# Patient Record
Sex: Female | Born: 1955 | Race: White | Marital: Married | Smoking: Former smoker
Health system: Northeastern US, Academic
[De-identification: ages and names within clinical notes are randomized; demographics above are authoritative.]

## PROBLEM LIST (undated history)

## (undated) HISTORY — PX: BREAST SURGERY: SHX581

## (undated) HISTORY — PX: OTHER SURGICAL HISTORY: SHX169

## (undated) HISTORY — PX: JOINT REPLACEMENT: SHX530

## (undated) HISTORY — PX: HYSTERECTOMY: SHX81

## (undated) HISTORY — PX: ROTATOR CUFF REPAIR: SHX139

---

## 2016-09-21 ENCOUNTER — Inpatient Hospital Stay: Admit: 2016-09-21 | Discharge: 2016-09-21 | Disposition: A | Discharge status: Home / Self Care | Payer: Self-pay

## 2017-09-23 ENCOUNTER — Inpatient Hospital Stay: Admit: 2017-09-23 | Discharge: 2017-09-23 | Disposition: A | Discharge status: Home / Self Care | Payer: Self-pay

## 2019-03-12 ENCOUNTER — Inpatient Hospital Stay: Admit: 2019-03-12 | Discharge: 2019-03-12 | Disposition: A | Discharge status: Home / Self Care | Payer: Self-pay

## 2019-06-16 ENCOUNTER — Telehealth: Payer: Self-pay

## 2019-06-16 NOTE — Telephone Encounter (Signed)
Writer called patient in regards to [place] with our office     Patient stated she wanted Dr.Berliant for her an her husband     Our faculty Provider's at [facility] are not accepting patient's at this time     Writer stated our Resident provider's are available    Patient stated its okay she will continue looking elsewhere with husband

## 2019-06-16 NOTE — Telephone Encounter (Signed)
Copied from [place] [id number]. Topic: Appointments - Schedule Appointment  >> Jun 16, 2019 10:46 AM Lynn Wagner wrote:  [name] is calling to schedule an appointment.    Appointment Type: NPV  Provider: Dr. Alonza Smoker - she used to be his patient many years ago, she will accept another doctor if he doesn't take her  What does patient need to be seen for?: NPV  Is there a referral on file?: no  Preferred location?: no  Type of insurance:MVP  Were the demographics and insurance verified?: yes  Was the patient connected to [facility] if applicable?: yes  Was the appropriate expectation set with the patient for a time frame to receive a return call from the office? yes    Patient can be reached at: [phone]

## 2019-06-30 ENCOUNTER — Other Ambulatory Visit
Admission: RE | Admit: 2019-06-30 | Discharge: 2019-06-30 | Disposition: A | Discharge status: Home / Self Care | Payer: No Typology Code available for payment source | Source: Ambulatory Visit | Attending: Internal Medicine | Admitting: Internal Medicine

## 2019-06-30 DIAGNOSIS — R5383 Other fatigue: Secondary | ICD-10-CM | POA: Insufficient documentation

## 2019-06-30 LAB — COMPREHENSIVE METABOLIC PANEL
ALT: 14 U/L (ref 0–35)
AST: 17 U/L (ref 0–35)
Albumin: 4.5 g/dL (ref 3.5–5.2)
Alk Phos: 89 U/L (ref 35–105)
Anion Gap: 10 (ref 7–16)
Bilirubin,Total: 0.5 mg/dL (ref 0.0–1.2)
CO2: 25 mmol/L (ref 20–28)
Calcium: 9.3 mg/dL (ref 8.6–10.2)
Chloride: 105 mmol/L (ref 96–108)
Creatinine: 0.86 mg/dL (ref 0.51–0.95)
GFR,Black: 83 *
GFR,Caucasian: 72 *
Glucose: 96 mg/dL (ref 60–99)
Lab: 17 mg/dL (ref 6–20)
Potassium: 4.7 mmol/L (ref 3.3–5.1)
Sodium: 140 mmol/L (ref 133–145)
Total Protein: 6.2 g/dL — ABNORMAL LOW (ref 6.3–7.7)

## 2019-06-30 LAB — CBC AND DIFFERENTIAL
Baso # K/uL: 0 10*3/uL (ref 0.0–0.1)
Basophil %: 0.4 %
Eos # K/uL: 0.1 10*3/uL (ref 0.0–0.4)
Eosinophil %: 2.6 %
Hematocrit: 42 % (ref 34–45)
Hemoglobin: 13.3 g/dL (ref 11.2–15.7)
IMM Granulocytes #: 0 10*3/uL (ref 0.0–0.0)
IMM Granulocytes: 0.4 %
Lymph # K/uL: 1.7 10*3/uL (ref 1.2–3.7)
Lymphocyte %: 36.9 %
MCH: 30 pg/cell (ref 26–32)
MCHC: 32 g/dL (ref 32–36)
MCV: 95 fL (ref 79–95)
Mono # K/uL: 0.4 10*3/uL (ref 0.2–0.9)
Monocyte %: 9.3 %
Neut # K/uL: 2.3 10*3/uL (ref 1.6–6.1)
Nucl RBC # K/uL: 0 10*3/uL (ref 0.0–0.0)
Nucl RBC %: 0 /100 WBC (ref 0.0–0.2)
Platelets: 236 10*3/uL (ref 160–370)
RBC: 4.4 MIL/uL (ref 3.9–5.2)
RDW: 13.6 % (ref 11.7–14.4)
Seg Neut %: 50.4 %
WBC: 4.5 10*3/uL (ref 4.0–10.0)

## 2019-06-30 LAB — TSH: TSH: 1.87 u[IU]/mL (ref 0.27–4.20)

## 2019-06-30 LAB — LIPID PANEL
Chol/HDL Ratio: 2.9
Cholesterol: 218 mg/dL — AB
HDL: 76 mg/dL — ABNORMAL HIGH (ref 40–60)
LDL Calculated: 122 mg/dL
Non HDL Cholesterol: 142 mg/dL
Triglycerides: 99 mg/dL

## 2019-06-30 LAB — MICROALBUMIN, URINE, RANDOM
Creatinine,UR: 29 mg/dL (ref 20–300)
Microalbumin,UR: <1.2 mg/dL

## 2019-06-30 LAB — HEMOGLOBIN A1C: Hemoglobin A1C: 5.3 %

## 2019-06-30 LAB — VITAMIN D: 25-OH Vit Total: 31 ng/mL (ref 30–60)

## 2019-06-30 LAB — GGT: GGT: 14 U/L (ref 5–36)

## 2019-07-16 ENCOUNTER — Other Ambulatory Visit: Payer: Self-pay | Admitting: Internal Medicine

## 2019-07-16 ENCOUNTER — Ambulatory Visit
Admission: RE | Admit: 2019-07-16 | Discharge: 2019-07-16 | Disposition: A | Discharge status: Home / Self Care | Payer: No Typology Code available for payment source | Source: Ambulatory Visit | Attending: Internal Medicine | Admitting: Internal Medicine

## 2019-07-16 DIAGNOSIS — M542 Cervicalgia: Secondary | ICD-10-CM | POA: Insufficient documentation

## 2019-07-16 DIAGNOSIS — M50321 Other cervical disc degeneration at C4-C5 level: Secondary | ICD-10-CM | POA: Insufficient documentation

## 2019-07-16 DIAGNOSIS — M4802 Spinal stenosis, cervical region: Secondary | ICD-10-CM

## 2019-08-14 ENCOUNTER — Ambulatory Visit: Payer: No Typology Code available for payment source | Admitting: Physical Medicine and Rehabilitation

## 2019-08-20 ENCOUNTER — Ambulatory Visit: Payer: No Typology Code available for payment source | Admitting: Physical Medicine and Rehabilitation

## 2019-08-28 ENCOUNTER — Encounter: Payer: Self-pay | Admitting: Gastroenterology

## 2019-08-28 ENCOUNTER — Ambulatory Visit: Payer: No Typology Code available for payment source | Admitting: Physical Medicine and Rehabilitation

## 2019-08-28 ENCOUNTER — Ambulatory Visit
Payer: No Typology Code available for payment source | Attending: Physical Medicine and Rehabilitation | Admitting: Rehabilitative and Restorative Service Providers"

## 2019-08-28 ENCOUNTER — Encounter: Payer: Self-pay | Admitting: Physical Medicine and Rehabilitation

## 2019-08-28 VITALS — BP 111/74 | HR 73 | Ht 64.17 in | Wt 170.4 lb

## 2019-08-28 DIAGNOSIS — S161XXD Strain of muscle, fascia and tendon at neck level, subsequent encounter: Secondary | ICD-10-CM | POA: Insufficient documentation

## 2019-08-28 DIAGNOSIS — M542 Cervicalgia: Secondary | ICD-10-CM

## 2019-08-28 NOTE — Progress Notes (Signed)
Physical Therapy Daily Flowsheet:  *Please see Physical Therapy Exercise Flowsheet for details regarding exercises completed this session.*     08/28/19 1100   Overview   Diagnosis neck pain   Insurance Medicare   Script Date 08/28/19   Visit # 1   Additional Comments Next appointment made prior to PT session completed   Pain Assessment    0-10 Scale 3;8   Pain Location Neck   Pain Orientation Lower   Modalities   Time MH CS   Patient Education   Educated in disease process Yes   Educated in home exercise program Yes   Time Calculation   PT Timed Codes 8   PT Untimed Codes 15   PT Total Treatment 23       Lorenza Chick PT, DPT
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
## 2019-08-28 NOTE — Progress Notes (Signed)
Physical Therapy Exercise Flowsheet:  *Please refer to Physical Therapy Daily Flowsheet for further details of this session.*     08/28/19 1100   Cervical Exercises   Levator Scapular Stretch Comment 1 set, 10x, tol well   Turtle Exercise Comment 1 set, 10x, tol well   Upper Trapezius Stretch, Sitting Comment 1 set, 10x, tol well   Addtional Exercises Carpal tunnel stretch 1 set, 10x, tol well   Shoulder Exercises   Scapular Retraction, Standing Comment 1 set, 10x, tol well   Shoulder Circles Comment 1 set, 10x, tol well   Total time 8 minutes       Lorenza Chick PT, DPT
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
## 2019-09-04 ENCOUNTER — Ambulatory Visit: Payer: No Typology Code available for payment source

## 2019-09-07 ENCOUNTER — Ambulatory Visit: Payer: No Typology Code available for payment source

## 2019-09-07 DIAGNOSIS — S161XXD Strain of muscle, fascia and tendon at neck level, subsequent encounter: Secondary | ICD-10-CM

## 2019-09-07 NOTE — Progress Notes (Signed)
Physical Therapy Exercise Flowsheet:  *Please refer to Physical Therapy Daily Flowsheet for further details of this session.*     09/07/19 0900   Cervical Exercises   Cervical Exercises Yes   Levator Scapular Stretch Comment 10 sec, 5 reps   Turtle Exercise Comment 10 rep, 5 sec   Upper Trapezius Stretch, Sitting Comment 10 sec, 5 reps   Addtional Exercises Carpal tunnel stretch 1 set, 10x, tol well   Total time 15 min   Shoulder Exercises   Shoulder Exercises Yes   Scapular Retraction, Standing Comment 15 reps, 5 sec   Shoulder Circles Comment 15 reps, 5 sec   Total time 10 min   Manual Therapy   Manual Therapy Yes   Myofacial Release Comments uppertraps, scalene, levator, rhomboid   Total time 10 min   Filipe Greathouse Hennie Duos, PTA

## 2019-09-07 NOTE — Progress Notes (Signed)
Physical Therapy Daily Flowsheet:  *Please see Physical Therapy Exercise Flowsheet for details regarding exercises completed this session.*     09/07/19 0900   Overview   Diagnosis neck pain   Insurance Medicare   Script Date 08/28/19   Visit # 2   Pain Assessment    0-10 Scale 2;3   Pain Location Neck   Pain Orientation Lower   Modalities   Modalities Yes   Moist Heat Yes   Time 10 min   Patient Education   Patient Education Yes   Educated in disease process Yes   Educated in home exercise program Yes   Time Calculation   PT Timed Codes 35 min   PT Untimed Codes 10 min   PT Unbilled Time 0   PT Total Treatment 45   Lynn Wagner [place], PTA

## 2019-09-11 ENCOUNTER — Ambulatory Visit: Payer: No Typology Code available for payment source

## 2019-09-11 DIAGNOSIS — S161XXD Strain of muscle, fascia and tendon at neck level, subsequent encounter: Secondary | ICD-10-CM

## 2019-09-11 NOTE — Progress Notes (Signed)
Physical Therapy Daily Flowsheet:  *Please see Physical Therapy Exercise Flowsheet for details regarding exercises completed this session.*     09/11/19 1350   Overview   Diagnosis neck pain   Insurance Medicare   Script Date 08/28/19   Visit # 3   Pain Assessment    0-10 Scale 3   Pain Location Neck   Pain Orientation Lower   Modalities   Modalities Yes   Cold Pack Yes   Time 10 min   Moist Heat Yes   Time 10 min   Patient Education   Patient Education Yes   Educated in disease process Yes   Educated in home exercise program Yes   Time Calculation   PT Timed Codes 50 min   PT Untimed Codes 20 min   PT Unbilled Time 0   PT Total Treatment 70   Shanya Ferriss [place], PTA

## 2019-09-11 NOTE — Progress Notes (Signed)
Physical Therapy Exercise Flowsheet:  *Please refer to Physical Therapy Daily Flowsheet for further details of this session.*     09/11/19 1351   Cervical Exercises   Cervical Exercises Yes   Active Cervical Sideflexion Comment 10 sec, 5 reps   Levator Scapular Stretch Comment hold   Turtle Exercise Comment 10 reps, 5 sec   Upper Trapezius Stretch, Sitting Comment 10 sec, 5 reps   Addtional Exercises Carpal tunnel stretch 1 set, 10x, tol well   Total time 15 min   Shoulder Exercises   Shoulder Exercises Yes   Shoulder External Rotation, Theraband Comment level 2, 10 reps, 5 sec   Shoulder Flexion, Theraband Comment level 2, 10 reps, 5 sec   Shoulder Extension, Theraband Comment level 2, 10 reps, 5 sec   Scapular Retraction, Standing Comment 20 reps, 5 sec   Shoulder Circles Comment 20 reps, 5 sec   Total time 20 min   Manual Therapy   Manual Therapy Yes   Myofacial Release Comments uppertraps, scalene, levator, rhomboid   Total time 15 min   Windie Marasco Hennie Duos, PTA

## 2019-09-15 ENCOUNTER — Ambulatory Visit: Payer: No Typology Code available for payment source

## 2019-09-15 DIAGNOSIS — S161XXD Strain of muscle, fascia and tendon at neck level, subsequent encounter: Secondary | ICD-10-CM

## 2019-09-15 NOTE — Progress Notes (Signed)
Physical Therapy Exercise Flowsheet:  *Please refer to Physical Therapy Daily Flowsheet for further details of this session.*     09/15/19 1257   Cervical Exercises   Cervical Exercises Yes   Active Cervical Sideflexion Comment 10 sec, 5 reps   Levator Scapular Stretch Comment hold   Turtle Exercise Comment 10 reps, 5 sec   Upper Trapezius Stretch, Sitting Comment 10 sec, 5 reps   Addtional Exercises Carpal tunnel stretch 1 set, 10x, tol well   Total time 10 min   Shoulder Exercises   Shoulder Exercises Yes   Shoulder External Rotation, Theraband Comment level 2, 10 reps, 5 sec   Shoulder Flexion, Theraband Comment level 2, 10 reps, 5 sec   Shoulder Extension, Theraband Comment level 2, 10 reps, 5 sec   Total time 15 min   Manual Therapy   Manual Therapy Yes   Suboccipital Release Comments yes   Myofacial Release Comments uppertraps, scalene, levator, rhomboid   Total time 20 min   Shanquita Ronning Hennie Duos, PTA

## 2019-09-15 NOTE — Progress Notes (Signed)
Physical Therapy Daily Flowsheet:  *Please see Physical Therapy Exercise Flowsheet for details regarding exercises completed this session.*     09/15/19 1254   Overview   Diagnosis neck pain   Insurance Medicare   Script Date 08/28/19   Visit # 4   Pain Assessment   Pain X    0-10 Scale 4;5   Pain Location Neck   Pain Orientation Lower   Modalities   Modalities Yes   Cold Pack Yes   Time 10 min   Moist Heat Yes   Time 10 min   Patient Education   Patient Education Yes   Educated in disease process Yes   Educated in home exercise program Yes   Time Calculation   PT Timed Codes 45 min   PT Untimed Codes 20 min   PT Unbilled Time 0   PT Total Treatment 65   Taziyah Iannuzzi [place], PTA

## 2019-09-21 ENCOUNTER — Ambulatory Visit: Payer: No Typology Code available for payment source

## 2019-09-21 DIAGNOSIS — S161XXD Strain of muscle, fascia and tendon at neck level, subsequent encounter: Secondary | ICD-10-CM

## 2019-09-21 NOTE — Progress Notes (Signed)
Physical Therapy Exercise Flowsheet:  *Please refer to Physical Therapy Daily Flowsheet for further details of this session.*     09/21/19 0900   Cervical Exercises   Cervical Exercises Yes   Active Cervical Sideflexion Comment 10 sec, 5 reps   Turtle Exercise Comment 10 reps, 5 sec   Upper Trapezius Stretch, Sitting Comment 10 sec, 5 reps   Addtional Exercises Carpal tunnel stretch 1 set, 10x, tol well   Total time 10 min   Shoulder Exercises   Shoulder Exercises Yes   Shoulder External Rotation, Theraband Comment level 2,10 reps, 5 sec, B   Shoulder Flexion, Theraband Comment level 2, 10 reps, 5 sec, B   Shoulder Extension, Theraband Comment level 2, 10 reps, 5 sec, B   Total time 15 min   Manual Therapy   Manual Therapy Yes   Suboccipital Release Comments yes   Myofacial Release Comments uppertraps, scalene, levator, rhomboid   Total time 20 min   Harl Wiechmann Hennie Duos, PTA

## 2019-09-21 NOTE — Progress Notes (Signed)
Physical Therapy Daily Flowsheet:  *Please see Physical Therapy Exercise Flowsheet for details regarding exercises completed this session.*     09/21/19 0925   Overview   Diagnosis neck pain   Insurance Medicare   Script Date 08/28/19   Visit # 5   Pain Assessment   Pain X    0-10 Scale 2   Pain Location Neck   Pain Orientation Lower   ROM   Flexion   (38 degrees)   Extension   (40 degrees)   Right Lateral Flexion   (35 degrees)   Left Lateral Flexion   (27 degrees)   Right Rotation   (75 degrees)   Left Rotation   (55 degrees)   STRENGTH   R Shoulder Flexion  4+/5   R Shoulder Internal Rotation  4+/5   R Shoulder External Rotation (STR) 4/5;4+/5   L Shoulder Flexion  4/5   L Shoulder Internal Rotation  4/5;4+/5   L Shoulder External Rotation 4-/5   Modalities   Modalities Yes   Cold Pack Yes   Time 10 min   Moist Heat Yes   Time 10 min   Functional Outcome Measures   Self Reported Functional Measures Yes   Neck Disability Index Yes   Pain Intensity 2   Personal Care 0   Lifting 1   Reading 0   Headache 0   Concentration 1   Work 0   Driving 0   Sleeping 2   Recreation 2   Neck Disability Index Score 16   Patient Education   Patient Education Yes   Educated in disease process Yes   Educated in home exercise program Yes   Time Calculation   PT Timed Codes 45 min   PT Untimed Codes 20 min   PT Unbilled Time 0   PT Total Treatment 65   Grayce Budden [place], PTA

## 2019-09-23 NOTE — Progress Notes (Addendum)
Department of Physical Medicine & Rehabilitation  Physical Therapy Progress Note    HISTORY:  Diagnosis:    Diagnosis: neck pain  Date of Surgery/Onset Date of Symptoms:        11/29/2018  Referring Provider:        Julieta Bellini, MD  Frequency:    Twice a week  Attendance:    Consistent  Patient's compliance with therapy and home exercise program:      good   Treatment:    AROM/PROM/Therapeutic exercise, Cold pack, Flexibility, Heat, Home exercise program instructions/Patient education, Myofascial release, Patient/Family Education, Postural training/body Curator education, Strengthening    SUBJECTIVE:  Pain:    Improved     0-10 Scale: 2   Pain Location: Neck   Pain Orientation: Lower     OBJECTIVE:  ROM:    ROM   Flexion: (38 degrees)   Extension: (40 degrees)   Right Lateral Flexion: (35 degrees)   Left Lateral Flexion: (27 degrees)   Right Rotation: (75 degrees)   Left Rotation: (55 degrees)  Strength:    STRENGTH   R Shoulder Flexion : 4+/5   R Shoulder Internal Rotation : 4+/5   R Shoulder External Rotation (STR): 4/5, 4+/5   L Shoulder Flexion : 4/5   L Shoulder Internal Rotation : 4/5, 4+/5   L Shoulder External Rotation: 4-/5  Function:    Improved   Functional Tasks:     Improving with: turning head side to side, reaching up into cupboards,               Table tops activities      Still difficulty with the following: pain with prolonged sitting, prolonged looking               Up and looking down    Functional Outcome Measures:    Self-Reported:     Neck Disability Index (NDI): Neck Disability Index Score: 16   Patient Education   Patient Education: Yes   Educated in disease process: Yes   Educated in home exercise program: Yes       Rehab potential/prognosis: good     Patient's understanding: good     Assessment:   Patient is a 63 y.o. female presents to physical therapy with pain secondary to signs and symptoms consistent with Diagnosis: Cervical/trap strain. Patient is slowly  making progress with ROM and strength but continues to have pain and difficulty with looking up activities, lifting things and prolonged sitting. Patient would benefit from continued skilled PT to improve strength, ROM and address goals below.    Short-term goals  ( two weeks)    1. Demonstrate an exercise program - Achieved  2. Patient education about postural mechanics - Achieved  3. Patient to sit erect 1 min. - Achieved    Long-term goals (four weeks)    1. Independent with a home exercise program - on going  2. Independent patient education about postural mechanics - Achieved  3. Patient to turn head without difficulty - partially achieved/progressing  4. Resume reaching and overhead activities without difficulty - partially achieved/progressing      Updated goals    Short-term goals  ( two weeks)    1. Independent with a home exercise program     Long-term goals (four weeks)    1. Patient to turn head without difficulty   2. Resume reaching and overhead activities without difficulty   3. Decrease NDI to <= 10                                                                           (  New goal )    Patient Goals for Therapy: Home exercise program     Thank you for the referral. If you have any questions and/or concerns, please feel free to contact me at [phone].     Plan: Continue PT 1 to 2 x per week for 1 month    Sherrielyn Hennie Duos, [place]    Lorenza Chick PT, DPT    Department of Physical [facility]    Physician:  Lenard Simmer, MD    I have reviewed your progress note and agree with the documented goals and Plan of Care      09/23/2019

## 2019-09-24 ENCOUNTER — Ambulatory Visit: Payer: No Typology Code available for payment source | Attending: Physical Medicine and Rehabilitation

## 2019-09-24 DIAGNOSIS — S161XXD Strain of muscle, fascia and tendon at neck level, subsequent encounter: Secondary | ICD-10-CM | POA: Insufficient documentation

## 2019-09-24 NOTE — Progress Notes (Signed)
Physical Therapy Exercise Flowsheet:  *Please refer to Physical Therapy Daily Flowsheet for further details of this session.*     09/24/19 0900   Cervical Exercises   Cervical Exercises Yes   Active Cervical Sideflexion Comment 10 sec, 5 reps   Turtle Exercise Comment 10 reps, 5 sec   Upper Trapezius Stretch, Sitting Comment 10 sec, 5 reps   Addtional Exercises Carpal tunnel stretch 1 set, 10x, tol well   Total time 10 min   Shoulder Exercises   Shoulder Exercises Yes   Shoulder External Rotation, Theraband Comment level 3, 10 reps, 5 sec   Shoulder Flexion, Theraband Comment level 3, 10 reps, 5 sec   Shoulder Extension, Theraband Comment level 3, 10 reps, 5 sec   Scapular Retraction, Standing Comment level 3, 10 reps, 5 sec   Total time 15 min   Manual Therapy   Manual Therapy Yes   Suboccipital Release Comments yes   Myofacial Release Comments uppertraps, scalene, levator, rhomboid   Total time 21 min   Ailsa Mireles Hennie Duos, PTA

## 2019-09-24 NOTE — Progress Notes (Signed)
Physical Therapy Daily Flowsheet:  *Please see Physical Therapy Exercise Flowsheet for details regarding exercises completed this session.*     09/24/19 0900   Overview   Diagnosis neck pain   Insurance Medicare   Script Date 08/28/19   Visit # 6   Pain Assessment   Pain X    0-10 Scale 2;3   Pain Location Neck   Pain Orientation Lower   Modalities   Modalities Yes   Cold Pack Yes   Time 10 min   Moist Heat Yes   Time 10 min   Patient Education   Patient Education Yes   Educated in disease process Yes   Educated in home exercise program Yes   Time Calculation   PT Timed Codes 46 min   PT Untimed Codes 20 min   PT Unbilled Time 0   PT Total Treatment [address]   Deagan Sevin [address], PTA

## 2019-09-29 ENCOUNTER — Ambulatory Visit: Payer: No Typology Code available for payment source

## 2019-10-02 ENCOUNTER — Ambulatory Visit: Payer: No Typology Code available for payment source

## 2019-10-02 DIAGNOSIS — S161XXD Strain of muscle, fascia and tendon at neck level, subsequent encounter: Secondary | ICD-10-CM

## 2019-10-02 NOTE — Progress Notes (Signed)
Physical Therapy Daily Flowsheet:  *Please see Physical Therapy Exercise Flowsheet for details regarding exercises completed this session.*     10/02/19 1353   Overview   Diagnosis neck pain   Insurance Medicare   Script Date 08/28/19   Visit # 7   Pain Assessment   Pain X    0-10 Scale 2;3   Pain Location Neck   Pain Orientation Lower   Modalities   Modalities Yes   Cold Pack Yes   Time 10 min   Moist Heat Yes   Time 10 min   Patient Education   Patient Education Yes   Educated in disease process Yes   Educated in home exercise program Yes   Time Calculation   PT Timed Codes 45 min   PT Untimed Codes 20 min   PT Unbilled Time 0   PT Total Treatment 65   Kasen Sako [place], PTA

## 2019-10-02 NOTE — Progress Notes (Signed)
Physical Therapy Exercise Flowsheet:  *Please refer to Physical Therapy Daily Flowsheet for further details of this session.*     10/02/19 1353   Cervical Exercises   Cervical Exercises Yes   Active Cervical Sideflexion Comment 10 sec, 5 reps   Turtle Exercise Comment 10 reps, 5 sec   Upper Trapezius Stretch, Sitting Comment 10 sec, 5 reps   Addtional Exercises Carpal tunnel stretch 1 set, 10x, tol well   Total time 10 min   Shoulder Exercises   Shoulder Exercises Yes   Shoulder External Rotation, Theraband Comment level 3,10 reps, 5 sec   Shoulder Flexion, Theraband Comment level 3, 10 reps, 5 sec   Shoulder Extension, Theraband Comment level 3, 10 reps, 5 sec   Scapular Retraction, Standing Comment level 3, 10 reps, 5 sec   Total time 15 min   Manual Therapy   Manual Therapy Yes   Suboccipital Release Comments yes   Myofacial Release Comments uppertraps, scalene, levator   Total time 20 min   Gus Littler Hennie Duos, PTA

## 2019-10-05 ENCOUNTER — Ambulatory Visit: Payer: No Typology Code available for payment source

## 2019-10-05 DIAGNOSIS — S161XXD Strain of muscle, fascia and tendon at neck level, subsequent encounter: Secondary | ICD-10-CM

## 2019-10-05 NOTE — Progress Notes (Signed)
Physical Therapy Exercise Flowsheet:  *Please refer to Physical Therapy Daily Flowsheet for further details of this session.*     10/05/19 0951   Cervical Exercises   Cervical Exercises Yes   Active Cervical Sideflexion Comment 10 sec, 5 reps   Turtle Exercise Comment 10 reps, 5 sec   Upper Trapezius Stretch, Sitting Comment 10 sec, 5 reps   Addtional Exercises Carpal tunnel stretch 1 set, 10x, tol well   Total time 10 min   Shoulder Exercises   Shoulder Exercises Yes   Shoulder External Rotation, Theraband Comment level 3, 10 reps, 5 sec   Shoulder Flexion, Theraband Comment level 3, 10 reps, 5 sec   Shoulder Extension, Theraband Comment level 3, 10 reps, 5 sec   Scapular Retraction, Standing Comment level 3, 10 reps, 5 sec   Total time 14 min   Manual Therapy   Manual Therapy Yes   Suboccipital Release Comments yes   Myofacial Release Comments uppertraps, scalene, levator   Total time 20 min   Karson Chicas Hennie Duos, PTA

## 2019-10-05 NOTE — Progress Notes (Signed)
Physical Therapy Daily Flowsheet:  *Please see Physical Therapy Exercise Flowsheet for details regarding exercises completed this session.*     10/05/19 0951   Overview   Diagnosis neck pain   Insurance Medicare   Script Date 08/28/19   Visit # 8   Pain Assessment   Pain X    0-10 Scale 2   Pain Location Neck   Pain Orientation Lower   Modalities   Modalities Yes   Cold Pack Yes   Time 10 min   Moist Heat Yes   Time 10 min   Patient Education   Patient Education Yes   Educated in disease process Yes   Educated in home exercise program Yes   Time Calculation   PT Timed Codes 44 min   PT Untimed Codes 20 min   PT Unbilled Time 0   PT Total Treatment 31   Taleisha Kaczynski [place], PTA

## 2019-10-09 ENCOUNTER — Ambulatory Visit: Payer: No Typology Code available for payment source

## 2019-10-09 ENCOUNTER — Ambulatory Visit: Payer: No Typology Code available for payment source | Admitting: Physical Medicine and Rehabilitation

## 2019-10-09 ENCOUNTER — Encounter: Payer: Self-pay | Admitting: Physical Medicine and Rehabilitation

## 2019-10-09 VITALS — BP 150/68 | HR 56 | Ht 64.0 in | Wt 170.0 lb

## 2019-10-09 DIAGNOSIS — M542 Cervicalgia: Secondary | ICD-10-CM

## 2019-10-09 DIAGNOSIS — S161XXD Strain of muscle, fascia and tendon at neck level, subsequent encounter: Secondary | ICD-10-CM

## 2019-10-09 MED ORDER — BUPIVACAINE HCL 0.25 % IJ SOLUTION *WRAPPED*
1.0000 mL | Freq: Once | Status: AC | PRN
Start: 2019-10-09 — End: 2019-10-09
  Administered 2019-10-09: 1 mL via SUBCUTANEOUS

## 2019-10-09 NOTE — Progress Notes (Signed)
Physical Therapy Daily Flowsheet:  *Please see Physical Therapy Exercise Flowsheet for details regarding exercises completed this session.*     10/09/19 1300   Overview   Diagnosis neck pain   Insurance Medicare   Script Date 08/28/19   Visit # 9   Additional Comments Patient just recieved trigger point injection.   Pain Assessment   Pain X    0-10 Scale 2   Pain Location Neck   Modalities   Modalities Yes   Cold Pack Yes   Time 10 min   Moist Heat Yes   Time 10 min   Patient Education   Patient Education Yes   Educated in disease process Yes   Educated in home exercise program Yes   Time Calculation   PT Timed Codes 30 min   PT Untimed Codes 20 min   PT Unbilled Time 0   PT Total Treatment 50   Linnaea Ahn [place], PTA

## 2019-10-09 NOTE — Progress Notes (Addendum)
Ms. Lynn Wagner is here today for a follow-up visit for her neck pain.  Overall she is reporting improvement in her pain and range of motion.  She has been attending physical therapy on a regular basis.  She still has some difficulty turning her neck to the right.    On exam patient has tenderness to palpation in bilateral upper traps.    Impression/plan: Cervical myofascial pain in the setting of cervical spondylosis.  Patient to continue physical therapy at this time.  I will perform a trigger point injection into bilateral cervical muscles.    Please refer to procedure note.    Patient is going to [place] for 9 weeks.  She would like to continue therapy there.  I have given her a prescription to have therapy out of state.

## 2019-10-09 NOTE — Progress Notes (Signed)
Physical Therapy Exercise Flowsheet:  *Please refer to Physical Therapy Daily Flowsheet for further details of this session.*     10/09/19 1300   Cervical Exercises   Cervical Exercises Yes   Active Cervical Sideflexion Comment 10 sec, 5 reps   Turtle Exercise Comment 10 reps, 5 sec   Upper Trapezius Stretch, Sitting Comment 10 sec, 5 reps   Addtional Exercises Carpal tunnel stretch 1 set, 10x, tol well   Total time 10 min   Manual Therapy   Manual Therapy Yes   Suboccipital Release Comments yes   Myofacial Release Comments uppertraps, scalene, levator   Total time 20 min   Dontre Laduca Hennie Duos, PTA

## 2019-10-09 NOTE — Procedures (Signed)
Injection - Tendon/Other    Date/Time: 10/09/2019 10:45 AM EST  Consent given by: patient  Site marked: site marked  Timeout: Immediately prior to procedure a time out was called to verify the correct patient, procedure, equipment, support staff and site/side marked as required     Procedure Details  Trigger Points: 1-2 muscles  Preparation: The site was prepped using the usual aseptic technique.  Anesthetics administered: 1 mL bupivacaine 0.25%; 1 mL bupivacaine 0.25%  Dressing:  A dry, sterile dressing was applied.  Patient tolerance: patient tolerated the procedure well with no immediate complications

## 2019-10-19 ENCOUNTER — Ambulatory Visit: Payer: No Typology Code available for payment source

## 2019-10-19 DIAGNOSIS — S161XXD Strain of muscle, fascia and tendon at neck level, subsequent encounter: Secondary | ICD-10-CM

## 2019-10-19 NOTE — Progress Notes (Signed)
Physical Therapy Daily Flowsheet:  *Please see Physical Therapy Exercise Flowsheet for details regarding exercises completed this session.*     10/19/19 1319   Overview   Diagnosis neck pain   Insurance Medicare   Script Date 08/28/19   Visit # 10   Pain Assessment   Pain Denies   Modalities   Modalities Yes   Cold Pack Yes   Time 10 min   Moist Heat Yes   Time 10 min   Patient Education   Patient Education Yes   Educated in disease process Yes   Educated in home exercise program Yes   Time Calculation   PT Timed Codes 43 min   PT Untimed Codes 20 min   PT Unbilled Time 0   PT Total Treatment 80   Juquan Reznick [place], PTA

## 2019-10-19 NOTE — Progress Notes (Signed)
Physical Therapy Exercise Flowsheet:  *Please refer to Physical Therapy Daily Flowsheet for further details of this session.*     10/19/19 1324   Cervical Exercises   Cervical Exercises Yes   Active Cervical Sideflexion Comment 10 sec, 5 reps   Turtle Exercise Comment 10 reps, 5 sec   Upper Trapezius Stretch, Sitting Comment 10 sec, 5 reps   Addtional Exercises Carpal tunnel stretch, 10 reps, 5 sec   Total time 10 min   Shoulder Exercises   Shoulder Exercises Yes   Shoulder External Rotation, Theraband Comment level 3, 10 reps, 5 sec   Shoulder Flexion, Theraband Comment level 3, 10 reps, 5 sec   Shoulder Extension, Theraband Comment level 3, 10 reps, 5 sec   Scapular Retraction, Standing Comment level 3, 10 reps, 5 sec   Total time 13 min   Manual Therapy   Manual Therapy Yes   Suboccipital Release Comments yes   Myofacial Release Comments uppertraps, scalene, levator   Total time 20 min   Zenovia Justman Hennie Duos, PTA

## 2019-10-26 ENCOUNTER — Ambulatory Visit: Payer: No Typology Code available for payment source | Attending: Physical Medicine and Rehabilitation

## 2019-10-26 DIAGNOSIS — S161XXD Strain of muscle, fascia and tendon at neck level, subsequent encounter: Secondary | ICD-10-CM | POA: Insufficient documentation

## 2019-10-26 NOTE — Progress Notes (Signed)
Physical Therapy Exercise Flowsheet:  *Please refer to Physical Therapy Daily Flowsheet for further details of this session.*     10/26/19 1330   Cervical Exercises   Cervical Exercises Yes   Active Cervical Sideflexion Comment 10 sec, 5 reps   Turtle Exercise Comment 10 reps, 5 sec   Upper Trapezius Stretch, Sitting Comment 10 sec, 5 reps   Addtional Exercises Carpal tunnel stretch, 10 reps, 5 sec   Total time 10 min   Shoulder Exercises   Shoulder Exercises Yes   Shoulder External Rotation, Theraband Comment level 4, 10 reps, 5 sec   Shoulder Flexion, Theraband Comment level 4, 10 reps, 5 sec   Shoulder Extension, Theraband Comment level 4, 10 reps, 5 sec   Scapular Retraction, Standing Comment level 4, 10 reps, 5 sec   Total time 12 min   Manual Therapy   Manual Therapy Yes   Suboccipital Release Comments yes   Myofacial Release Comments uppertraps, scalene, levator, rhomboid   Total time 20 min   Makenleigh Crownover Eulis Foster, PTA

## 2019-10-26 NOTE — Progress Notes (Signed)
Physical Therapy Daily Flowsheet:  *Please see Physical Therapy Exercise Flowsheet for details regarding exercises completed this session.*     10/26/19 1330   Overview   Diagnosis neck pain   Insurance Medicare   Script Date 08/28/19   Visit # 11   Pain Assessment   Pain Denies   Modalities   Modalities Yes   Cold Pack Yes   Time 10 min   Moist Heat Yes   Time 10 min   Functional Outcome Measures   Self Reported Functional Measures Yes   Neck Disability Index Yes   Pain Intensity 1   Personal Care 0   Lifting 0   Reading 1   Headache 1   Concentration 0   Work 2   Driving 0   Sleeping 2   Recreation 2   Neck Disability Index Score 18   Patient Education   Patient Education Yes   Educated in disease process Yes   Educated in home exercise program Yes   Time Calculation   PT Timed Codes 42 min   PT Untimed Codes 20 mn   PT Unbilled Time 0   PT Total Treatment 62   Jayro Mcmath [place], PTA

## 2019-10-27 NOTE — Progress Notes (Addendum)
Department of Physical Medicine & Rehabilitation  Physical Therapy Progress Note    HISTORY:  Diagnosis:    Diagnosis: neck pain  Date of Surgery/Onset Date of Symptoms:        11/29/2018  Referring Provider:        Julieta Bellini, MD  Frequency:    Once a week  Attendance:    Consistent  Patient's compliance with therapy and home exercise program:      good   Treatment:    AROM/PROM/Therapeutic exercise, Cold pack, Flexibility, Heat, Home exercise program instructions/Patient education, Myofascial release, Patient/Family Education, Postural training/body Curator education, Strengthening    SUBJECTIVE:  Pain:    Improved      OBJECTIVE:  ROM:   ROM  ROM: Yes  Flexion: (37 degrees)  Extension: (26 degrees)  Right Lateral Flexion: (38 degrees)  Left Lateral Flexion: (28 degrees)  Right Rotation: (75 degrees)  Left Rotation: (65 degrees)  Strength:   STRENGTH  Strength: Yes  R Shoulder Flexion : 5/5  R Shoulder Internal Rotation : 5/5  R Shoulder External Rotation (STR): 4+/5  L Shoulder Flexion : 4/5, 4+/5  L Shoulder Internal Rotation : 4+/5  L Shoulder External Rotation: 4/5, 4+/5   Level 4 Theraband Exercises  Function:    Improved   Functional Tasks:     Improving with: turning head side to side with driving,  overhead reaching, table tops activities,              Decreased pain with prolonged sitting                      Still difficulty with the following: turning head all the way with driving    Functional Outcome Measures:    Self-Reported:     Neck Disability Index (NDI): Neck Disability Index Score: 18  Patient Education  Patient Education: Yes  Educated in disease process: Yes   Educated in home exercise program: Yes       Assessment:   Patient is a 64 y.o. female presents with improved cervical ROM with turning head side to side and reaching overhead. Patient still having some limitations with cervical rotation but better since initial evaluation. Patient will be going away for 9 months. Patient to  continue with HEP at this time and will call if she has questions. Patient is discharged from PT.    Short-term goals ( two weeks)    1. Demonstrate an exercise program - Achieved  2. Patient education about postural mechanics - Achieved  3. Patient to sit erect 1 min. - Achieved    Long-term goals (four weeks)    1. Independent with a home exercise program - Achieved  2. Independent patient education about postural mechanics - Achieved  3. Patient to turn head without difficulty - partially achieved/ To continue with HEP  4. Resume reaching and overhead activities without difficulty - Achieved    Updated goals    Short-term goals ( two weeks)    1. Independent with a home exercise program - Achieved    Long-term goals (four weeks)    1. Patient to turn head without difficulty - partially achieved/ To continue with HEP  2. Resume reaching and overhead activities without difficulty - Achieved  3. Decrease NDI to <= 10  - not achieved                                                                        (  New goal )    Patient Goals for Therapy:Home exercise program    Thank you for the referral. If you have any questions and/or concerns, please feel free to contact me at [phone].            Plan: Patient will be going away for 9 weeks. Patient to continue with HEP. Patient is discharged. From PT and will try to find a PT facility in [place].    Sherrielyn Hennie Duos, PTA    Lorenza Chick PT, DPT    Department of [facility]    Physician:  Lenard Simmer, MD    I have reviewed your progress note and agree with the documented goals and Plan of Care      10/27/2019

## 2020-01-07 ENCOUNTER — Ambulatory Visit: Payer: No Typology Code available for payment source | Admitting: Physical Medicine and Rehabilitation

## 2020-01-07 ENCOUNTER — Encounter: Payer: Self-pay | Admitting: Physical Medicine and Rehabilitation

## 2020-01-07 VITALS — BP 145/67 | HR 59 | Ht 64.0 in | Wt 178.0 lb

## 2020-01-07 DIAGNOSIS — M542 Cervicalgia: Secondary | ICD-10-CM

## 2020-01-07 NOTE — Progress Notes (Signed)
Lynn Wagner is an established patient of mine as previously seen in December.  She was in [place] for the last 9 weeks.  She was seeing a chiropractor there.  She still feels tightness in her neck and difficulty moving her neck with locking up with turning.  She denies pain into either arm.  She does report improvement temporarily after trigger point injection performed in December.    Prior cervical spine x-ray was notable for facet arthropathy.    On exam patient has restriction in cervical range of motion in all planes.  She has tenderness to palpation in the upper paracervical musculature.    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  I will refer her to one of my partners for consideration of cervical RFA.  I would consider repeat trigger point injections in conjunction with PT.  She will be referred back to physical therapy.

## 2020-01-10 ENCOUNTER — Other Ambulatory Visit: Payer: Self-pay | Admitting: Pulmonary Disease

## 2020-01-10 DIAGNOSIS — Z23 Encounter for immunization: Secondary | ICD-10-CM

## 2020-01-13 ENCOUNTER — Ambulatory Visit
Payer: No Typology Code available for payment source | Attending: Physical Medicine and Rehabilitation | Admitting: Physical Therapy

## 2020-01-13 DIAGNOSIS — S161XXD Strain of muscle, fascia and tendon at neck level, subsequent encounter: Secondary | ICD-10-CM | POA: Insufficient documentation

## 2020-01-13 NOTE — Progress Notes (Signed)
Physical Therapy Exercise Flowsheet:  *Please refer to Physical Therapy Daily Flowsheet for further details of this session.*       01/13/20 0900   Cervical Exercises   Cervical Retraction, Sitting Comment 10x3sec   Levator Scapular Stretch Comment 10x10"   Shoulder Exercises   Scapular Retraction, Standing Comment 10x3sec   Total time 8       Jaylie Neaves R. Petrushesky, PT, DPT

## 2020-01-13 NOTE — Progress Notes (Signed)
Physical Therapy Daily Flowsheet:  *Please see Physical Therapy Exercise Flowsheet for details regarding exercises completed this session.*       01/13/20 0800   Overview   Diagnosis Neck Pain   Insurance MVP   Script Date 01/07/20   Visit # 1   Additional Comments HEP: Cervical Retractions, levator Scapulae Stretch, Scapular Retractions   Modalities   Moist Heat Yes   Time 10   Functional Outcome Measures   Pain Intensity 2   Personal Care 0   Lifting 0   Reading 1   Headache 2   Concentration 0   Work 1   Driving 2   Sleeping 2   Recreation 2   Neck Disability Index Score 24   Patient Education   Educated in disease process Yes   Educated in home exercise program Yes   Time Calculation   PT Timed Codes 8   PT Untimed Codes 30   PT Unbilled Time 0   PT Total Treatment 38       Kyree Adriano R. Petrushesky, PT, DPT

## 2020-01-13 NOTE — Progress Notes (Signed)
Department of Physical Medicine & Rehabilitation  Physical Therapy Initial Assessment    History/Subjective    Diagnosis: Neck Pain    Referring practitioner: Julieta Bellini, MD    Onset date of symptoms:  A few months ago    Mechanism of injury: Unknown     Subjective: Lynn Wagner is a 64 y.o. female presenting to therapy with a chief complaint of neck pain. She reports that she recently finished therapy for her neck back in January before going out of town for several weeks. She adds that she was seeing a chiropractor when she was out of town who provided some manual traction for her neck which she found mildly helpful. Lynn Wagner reports that she feels as if manual therapy helps her a great deal, as well as coming into the clinic to do her exercises. She notes that sleep is difficult and that she would like to play golf this season. 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 Lynn Wagner adds that she will use heat to ease her pain. She notes that her pain is all over her neck, but worse in the L side, she denies radicular symptoms today.     Sports/Activities/Functional limitations:  Painting, Golf, Driving, Washing her hair    Symptoms Worsen With: Looking up for long periods, lifting heavy objects     Symptoms Better With: Heat, rest    No past medical history on file.  No past surgical history on file.    Comorbidities affecting treatment/recovery:   Joint pain: Neck  Personal factors affecting treatment/recovery:   none identified    Clinical presentation:   stable    Patient complexity:     low level as indicated by above stability of condition, personal factors, environmental factors and comorbidities in addition to their impairments found on physical exam.    Pain:   Today: 4/10     Location: Neck     Objective    Observation: Pleasant, cooperative female in NAD.       ROM Cervical Spine %   Flexion 75   Extension 50*   Right Sidebending 25*   Left  Sidebending 50   Right Rotation 50*   Left Rotation 50*     *patient reports global stiffness    Strength:    Left UE: Grossly 4/5  Right UE: Grossly 4/5        SPECIAL TEST Cervical Spine + / -   Vertebral Artery    Spurling's -   Compression -   Distraction         NDI: 24%    Assessment:  Lynn Wagner is a 64 y.o. female presenting to therapy with a chief complaint of neck pain. She currently presents with decreased ROM, decreased strength, increased pain, and decreased tolerance to functional activity. Due to her impairments, she is unable to tolerate functional or leisure activities without complaints of pain. She will benefit from skilled therapy services to address impairments and goals below.     Rehab potential/prognosis: good  Patient's understanding: good    Patient's goal:  To be able to swing a golf club    Plan  Plan of Care: Appropriate for PT    PT interventions: AROM/PROM/Therapeutic exercise, Balance activites, Closed chain activites, Cold, Flexibility, Gait training/Functional activities, General conditioning, Heat, Home exercise program instruction, Manual therapy, McKenzie exercises, Myofacial release, Neuromuscular Re-education, Patient/Family Education, Strengthening, TENS, Therapeutic Activities    PT frequency:  Once  a week    PT duration: 8 weeks      Short term goals (4 weeks):  1. Initiate HEP  2. Independent with home thermal agents  3. NDI </= 20%  4. Patient will restore cervical ROM by 25% in all planes to improve tolerance to activities.     Long term goals (8 weeks):  1. Independent with final HEP  2. NDI </= 15%  3. Patient will be able to perform overhead activities for 10 minutes without reports of neck pain.   4. Patient will restore cervical ROM to [facility] in all planes to improve tolerance to activities.       Thank you for the referral.  If you have any questions and/or concerns, please feel free to contact me at [phone].      Daysie Helf R. Petrushesky, PT, DPT

## 2020-01-21 ENCOUNTER — Ambulatory Visit: Payer: No Typology Code available for payment source | Attending: Physical Medicine and Rehabilitation

## 2020-01-21 DIAGNOSIS — S161XXD Strain of muscle, fascia and tendon at neck level, subsequent encounter: Secondary | ICD-10-CM | POA: Insufficient documentation

## 2020-01-21 NOTE — Progress Notes (Signed)
Physical Therapy Daily Flowsheet:  *Please see Physical Therapy Exercise Flowsheet for details regarding exercises completed this session.*     01/21/20 1122   Overview   Diagnosis Neck Pain   Insurance MVP   Script Date 01/07/20   Visit # 2   Modalities   Modalities Yes   Cold Pack Yes   Time 10 min   Moist Heat Yes   Time 10 min   Patient Education   Patient Education Yes   Educated in disease process Yes   Educated in home exercise program Yes   Time Calculation   PT Timed Codes 30 min   PT Untimed Codes 20 min   PT Unbilled Time 0   PT Total Treatment 50   A[place] Dominique [place], PTA

## 2020-01-21 NOTE — Progress Notes (Signed)
Physical Therapy Exercise Flowsheet:  *Please refer to Physical Therapy Daily Flowsheet for further details of this session.*     01/21/20 1122   Cervical Exercises   Cervical Exercises Yes   Cervical Retraction, Sitting Comment 10 reps, 5 sec   Levator Scapular Stretch Comment 10 sec, 10 reps   Total time 6 min   Shoulder Exercises   Scapular Retraction, Standing Comment 10 reps, 5 sec   Total time 2 min   Manual Therapy   Manual Therapy Yes   Suboccipital Release Comments yes   Myofacial Release Comments uppertraps, scalene, levator   Total time 22 min   Brantleigh Mifflin Eulis Foster, PTA

## 2020-01-25 ENCOUNTER — Ambulatory Visit: Payer: No Typology Code available for payment source | Admitting: Physical Medicine and Rehabilitation

## 2020-01-25 ENCOUNTER — Encounter: Payer: Self-pay | Admitting: Physical Medicine and Rehabilitation

## 2020-01-25 ENCOUNTER — Ambulatory Visit: Payer: No Typology Code available for payment source

## 2020-01-25 VITALS — BP 132/76 | HR 63 | Ht 64.0 in | Wt 172.0 lb

## 2020-01-25 DIAGNOSIS — M5481 Occipital neuralgia: Secondary | ICD-10-CM

## 2020-01-25 DIAGNOSIS — M7918 Myalgia, other site: Secondary | ICD-10-CM

## 2020-01-25 DIAGNOSIS — S161XXD Strain of muscle, fascia and tendon at neck level, subsequent encounter: Secondary | ICD-10-CM

## 2020-01-25 DIAGNOSIS — M47812 Spondylosis without myelopathy or radiculopathy, cervical region: Secondary | ICD-10-CM

## 2020-01-25 NOTE — Progress Notes (Signed)
Physical Therapy Daily Flowsheet:  *Please see Physical Therapy Exercise Flowsheet for details regarding exercises completed this session.*     01/25/20 1130   Overview   Diagnosis Neck Pain   Insurance MVP   Script Date 01/07/20   Visit # 3   Additional Comments Patient reports she just received trigger point injection.   Modalities   Modalities Yes   Cold Pack Yes   Time 10 min   Moist Heat Yes   Time 10 min   Patient Education   Patient Education Yes   Educated in disease process Yes   Educated in home exercise program Yes   Time Calculation   PT Timed Codes 31 min   PT Untimed Codes 20 min   PT Unbilled Time 0   PT Total Treatment 51   Evalee Gerard [place], PTA

## 2020-01-25 NOTE — Progress Notes (Signed)
Physical Therapy Exercise Flowsheet:  *Please refer to Physical Therapy Daily Flowsheet for further details of this session.*     01/25/20 1100   Cervical Exercises   Cervical Exercises Yes   Cervical Retraction, Sitting Comment 10 reps, 5 sec   Levator Scapular Stretch Comment 10 sec, 10 reps   Total time 5 min   Shoulder Exercises   Shoulder Exercises Yes   Scapular Retraction, Standing Comment 10 reps, 5 sec   Total time 2 min   Manual Therapy   Manual Therapy Yes   Suboccipital Release Comments yes   Myofacial Release Comments uppertraps, scalene, levator, rhomboid   Total time 24 min   Dion Parrow Eulis Foster, PTA

## 2020-01-25 NOTE — Progress Notes (Signed)
Dear Dr. Tish Men, [place], MBBS:    History of present illness: Lynn Wagner has been seen recently by my partner, Dr. Julieta Bellini, MD, and treated conservatively for a chief complaint of primarily axial neck pain and bilateral occipital headaches.  Patient denies any radiating symptoms to the upper extremities. Patient states symptoms continue to progress despite conservative care including physical rehabilitation measures as well as oral analgesics.  Patient reports the quality of symptoms vary from aching, throbbing, and stabbing and reports the intensity of pain up to 9/10 but is worsening.      Social/functional history: Patient reports limitations with activity level, hobbies, and sleep due to pain.    Review of systems: Lynn Wagner denies any bowel or bladder dysfunction, constitutional symptoms, or progressive upper extremity weakness.     Physical examination/focused musculoskeletal/spine examination:    Vitals:    01/25/20 0919   BP: 132/76   Pulse: 63   Weight: 78 kg (172 lb)   Height: 1.626 m (5\' 4" )       Alert awake appropriate female patient in no acute distress..    Palpation of the cervical spinous processes, paraspinal muscles and bilateral occipital regions elicits diffuse tenderness with active palpatory trigger points/taut bands in the bilateral trapezius muscles which on palpation elicit a focal twitch response.  Focal palpation the bilateral occipital regions reproduces the patient's bilateral occipital headaches.    Strength is intact 5 out 5 upper extremities bilaterally throughout all cervical myotomes.      Cervical spine range of motion is limited in extension, rotation, and side bending due to neck pain.    Radiologic review:    1.  X-rays of the cervical spine dated July 16, 2019 was personally reviewed in the office today demonstrating moderate to severe degenerative joint disease at C5-6, mild to moderate at C4-5 with severe bony foraminal stenosis at C3-4 mild bony  foraminal stenosis at C4-5 and C5-6.    Impression:    #1.  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    Therapeutic plan:    #1.  July 18, 2019 and I discussed the importance of continuing outpatient and home physical therapy/rehabilitation measures continuing to focus on an active dynamic cervical spine stabilization program focusing on postural retraining exercises, core strengthening, stretching, conditioning, and range of motion exercises.    #2.  The patient was instructed to avoid unnecessary cervical extension.  Of note, the patient is unable to take the offered NSAID course due to Covid vaccine.    #3.  The patient underwent a therapeutic bilateral occipital nerve injection the office today.  Please see the attached procedure note for details.    #4. The patient underwent a therapeutic bilateral trapezius muscle trigger point injection today in the office.  Please see the attached procedure note for details.    #5.  The patient will followup in my office after the aformentioned continued physical rehabilitation efforts and today's interventional procedures in 3-4 weeks to assess progress and discuss further options if needed.    Thank you for allowing me to participate in the care your patient, Lynn Wagner.  Please feel free to contact me with any questions you may have.    Sincerely,    Tishana Clinkenbeard K. Hinda Lenis.D.  PROCEDURE NOTE:    Date: 01/25/2020   Patient: Lynn Wagner   Record Number: [id number]   Physician:  Buford Dresser, MD   Side: R and L   Procedure: Occipital Nerve Injection  Reason for procedure: Occipital Neuralgia     Procedure: The purpose and details of the procedure as well as the risks, benefits and potential  complications were explained to the patient.  Verbal consent was obtained.      The patient was placed in a prone position on the table.  The patient chin was flexed.  The skin overlying the upper posterior neck, and occiput were prepped in the usual sterile manner.  The skin overlying the right and left occipital artery was identified by palpation.  Then, on each of the right and left sides, 25 gauge 5/8 inch needles were then inserted superficially just medial to the right and left occipital artery at the level of the superior nuchal line.  Then, after aspiration verified the right and left needle tips were not in a blood vessel, 3 cc of 1% Lidocaine was injected and anesthesia of the right and left occipital nerves was achieved.  The right and left needles were removed, the patients needle sites were dressed with bandages, and the patient was discharged without complication.      Buford Dresser, MD        PROCEDURE NOTE:    Date: 01/25/2020   Patient: Lynn Wagner   Record Number: [id number]   Physician:  Buford Dresser, MD   Side/Muscle: R and L trapezius muscle  Procedure: Intra-muscular trigger point injection  Reason for procedure: Myofascial Pain Syndrome     Procedure: The purpose and details of the procedure as well as the risks, benefits and potential complications were explained to the patient.  Verbal consent was obtained.      The patient was placed in a prone position on the table.  The cervical region, posterior neck, and periscapular region was prepped and draped in the usual sterile manner.  On the right and left side, the skin overlying each trigger point of was identified, and a skin wheal was raised with 1% Xylocaine.  Then, on each of the right and left sides, a 25 gauge 3  inch needle was then utilized to pierce the taut band of muscle tissue and repeated to mechanically break up the taut band.  Simultaneously, 2 cc of 1% Lidocaine was injected after aspiration verified that the needle tip was not  in a blood vessel.    The patient was then dressed with a bandage and discharged without complication.      Buford Dresser, MD

## 2020-02-04 ENCOUNTER — Ambulatory Visit: Payer: No Typology Code available for payment source

## 2020-02-04 DIAGNOSIS — S161XXD Strain of muscle, fascia and tendon at neck level, subsequent encounter: Secondary | ICD-10-CM

## 2020-02-04 NOTE — Progress Notes (Signed)
Physical Therapy Daily Flowsheet:  *Please see Physical Therapy Exercise Flowsheet for details regarding exercises completed this session.*     02/04/20 0857   Overview   Diagnosis Neck Pain   Insurance MVP   Script Date 01/07/20   Visit # 4   Pain Assessment   Pain X    0-10 Scale 6   Pain Location Neck   Modalities   Modalities Yes   Cold Pack Yes   Time 10 min   Moist Heat Yes   Time 10 min   Patient Education   Patient Education Yes   Educated in disease process Yes   Educated in home exercise program Yes   Time Calculation   PT Timed Codes 43 min   PT Untimed Codes 20 min   PT Unbilled Time 0   PT Total Treatment 63   [place], PTA

## 2020-02-04 NOTE — Progress Notes (Signed)
Physical Therapy Exercise Flowsheet:  *Please refer to Physical Therapy Daily Flowsheet for further details of this session.*     02/04/20 0900   Cervical Exercises   Cervical Exercises Yes   Active Cervical Sideflexion Comment 10 sec, 5 reps   Cervical Retraction, Sitting Comment 10 reps, 5 sec   Levator Scapular Stretch Comment 10 sec, 5 reps   Upper Trapezius Stretch, Sitting Comment 10 sec, 5 reps   Addtional Exercises SNAG, cervical extension, with towel, 10 reps,  5 sec   additional exercise SNAG, cervical rotation, with towel, 10 reps, 5 sec   Total time 10 min   Shoulder Exercises   Shoulder Exercises Yes   Shoulder External Rotation, Theraband Comment level 2, 10 reps, 5 sec, B   Shoulder Flexion, Theraband Comment level 2, 10 reps, 5 sec, B   Shoulder Extension, Theraband Comment level 2, 10 reps, 5 sec, B   Shoulder Internal Rotation, Theraband Comment level 2, 10 reps, 5 sec   Total time 8 min   Manual Therapy   Manual Therapy Yes   Suboccipital Release Comments yes   Myofacial Release Comments upper trap, scalene, levator, rhomboid   Total time 25 min   Summer Parthasarathy Eulis Foster, PTA

## 2020-02-05 NOTE — Progress Notes (Addendum)
Department of Physical Medicine & Rehabilitation  Physical Therapy Progress Note    HISTORY:  Diagnosis:    Diagnosis: Neck Pain  Date of Surgery/Onset Date of Symptoms:        01/07/2020  Referring Provider:        Lenard Simmer, MD  Frequency:    Once a week  Attendance:    Consistent  Patient's compliance with therapy and home exercise program:      good   Treatment:    AROM/PROM/Therapeutic exercise, Cold pack, Flexibility, Heat, Home exercise program instructions/Patient education, Myofascial release, Patient/Family Education, Postural training/body Dealer education, Strengthening    SUBJECTIVE:  Pain:    Improved     0-10 Scale: 6   Pain Location: Neck     OBJECTIVE:  ROM:   ROM  Flexion:  (22 degrees)  Extension:  (40 degrees)  Right Lateral Flexion:  (30 degrees)  Left Lateral Flexion:  (15 degrees)  Right Rotation:  (72 degrees)  Left Rotation:  (60 degrees)  Strength:   STRENGTH  R Shoulder Flexion : 4+/5, 5/5  R Shoulder Internal Rotation : 5/5  R Shoulder External Rotation (STR): 5/5  L Shoulder Flexion : 4+/5  L Shoulder Internal Rotation : 5/5  L Shoulder External Rotation: 4+/5  Function:   Improving with: reaching up into cupboards, table tops activities    Still difficulty with the following: pain with looking up activities, turning head side to side with driving and difficulty sleeping at night  Functional Outcome Measures:   Self-Reported:    Disability of arm, shoulder and hand (DASH):  ;  ;    Lower Limb Functional Scale:    Neck Disability Index (NDI): Neck Disability Index Score: 32  Oswestry(Low Back Pain and Disability Index):         Patient Education  Patient Education: Yes  Educated in disease process: Yes  Educated in home exercise program: Yes       Progress Towards Previous Goals: good           Assessment: Lynn Wagner is a 64 y.o. female presenting to therapy with a chief complaint of neck pain. She has been attending therapy for approximately 4 weeks, where a focus has been on  improving ROM, improving UE strength, decreasing pain, and increasing activity tolerance. Lynn Wagner has been able to tolerate interventions well so far and demonstrates compliance with her HEP. Upon re-assessment today, she has been making progress in regards to increasing her ROM and strength. She continues to complain of pain while looking up, as well as while lifting heavy objects. She has achieved a few short term goals at this time, long term goals remain appropriate. Skilled therapy services remain necessary to address impairments and goals below.       Rehab potential/prognosis: good  Patient's understanding: good    Patient's goal:  To be able to swing a golf club      Short term goals (4 weeks):  1. Initiate HEP - Achieved  2. Independent with home thermal agents - Achieved  3. NDI </= 20% - on going  4. Patient will restore cervical ROM by 25% in all planes to improve tolerance to activities. - on going     Long term goals (8 weeks):  1. Independent with final HEP - on going  2. NDI </= 15% - on going  3. Patient will be able to perform overhead activities for 10 minutes without reports of neck pain. - on going  4. Patient will restore cervical ROM to [facility] in all planes to improve tolerance to activities. - on going        Thank you for the referral.  If you have any questions and/or concerns, please feel free to contact me at [phone].        Plan: Continue PT            Lynn Wagner, PTA  Nicholaus Corolla. Petrushesky, PT, DPT

## 2020-02-10 ENCOUNTER — Ambulatory Visit: Payer: No Typology Code available for payment source

## 2020-02-10 DIAGNOSIS — S161XXD Strain of muscle, fascia and tendon at neck level, subsequent encounter: Secondary | ICD-10-CM

## 2020-02-10 NOTE — Progress Notes (Signed)
Physical Therapy Daily Flowsheet:  *Please see Physical Therapy Exercise Flowsheet for details regarding exercises completed this session.*     02/10/20 0930   Overview   Diagnosis Neck pain   Insurance MVP   Script Date 01/07/20   Visit # 5   Pain Assessment   Pain X    0-10 Scale 5   Pain Location Neck   Modalities   Modalities Yes   Cold Pack Yes   Time 10 min   Moist Heat Yes   Time 10 min   Patient Education   Patient Education Yes   Educated in disease process Yes   Educated in home exercise program Yes   Time Calculation   PT Timed Codes 40 min   PT Untimed Codes 20 min   PT Unbilled Time 0   PT Total Treatment 60   Lynn Wagner, PTA

## 2020-02-10 NOTE — Progress Notes (Signed)
Physical Therapy Exercise Flowsheet:  *Please refer to Physical Therapy Daily Flowsheet for further details of this session.*     02/10/20 0930   Cervical Exercises   Cervical Exercises Yes   Active Cervical Sideflexion Comment 10 sec, 5 reps   Cervical Retraction, Sitting Comment 10 reps, 5 sec   Levator Scapular Stretch Comment 10 sec, 5 reps   Upper Trapezius Stretch, Sitting Comment 10 sec, 5 reps   Addtional Exercises SNAG, cervical extension, 10 reps, 5 sec   additional exercise SNAG, cervical rotation, 10 reps, 5 sec   Total time 10 min   Shoulder Exercises   Shoulder Exercises Yes   Shoulder External Rotation, Theraband Comment level 2, 10 reps, 5 sec   Shoulder Flexion, Theraband Comment level 2, 10 reps, 5 sec   Shoulder Extension, Theraband Comment level 2, 10 reps, 5 sec   Shoulder Internal Rotation, Theraband Comment level 2, 10 reps ,5 sec   Total time 10 min   Manual Therapy   Manual Therapy Yes   Suboccipital Release Comments yes   Myofacial Release Comments upper trap, scalene, levator, rhomboid   Total time 20 min   Estanislado Surgeon Eulis Foster, PTA

## 2020-02-11 ENCOUNTER — Ambulatory Visit: Payer: No Typology Code available for payment source

## 2020-02-16 ENCOUNTER — Ambulatory Visit
Admission: RE | Admit: 2020-02-16 | Discharge: 2020-02-16 | Disposition: A | Discharge status: Home / Self Care | Payer: No Typology Code available for payment source | Source: Ambulatory Visit | Attending: Internal Medicine | Admitting: Internal Medicine

## 2020-02-16 ENCOUNTER — Other Ambulatory Visit: Payer: Self-pay | Admitting: Internal Medicine

## 2020-02-16 DIAGNOSIS — M25559 Pain in unspecified hip: Secondary | ICD-10-CM

## 2020-02-16 DIAGNOSIS — M47816 Spondylosis without myelopathy or radiculopathy, lumbar region: Secondary | ICD-10-CM | POA: Insufficient documentation

## 2020-02-16 DIAGNOSIS — M5137 Other intervertebral disc degeneration, lumbosacral region: Secondary | ICD-10-CM

## 2020-02-16 DIAGNOSIS — M4317 Spondylolisthesis, lumbosacral region: Secondary | ICD-10-CM

## 2020-02-17 ENCOUNTER — Ambulatory Visit: Payer: No Typology Code available for payment source

## 2020-02-17 DIAGNOSIS — S161XXD Strain of muscle, fascia and tendon at neck level, subsequent encounter: Secondary | ICD-10-CM

## 2020-02-17 NOTE — Progress Notes (Signed)
Physical Therapy Daily Flowsheet:  *Please see Physical Therapy Exercise Flowsheet for details regarding exercises completed this session.*     02/17/20 1000   Overview   Diagnosis Neck pain   Insurance MVP   Script Date 01/07/20   Visit # 6   Pain Assessment   Pain X    0-10 Scale 7   Pain Location Neck   Modalities   Modalities Yes   Cold Pack Yes   Time 10 min   Moist Heat Yes   Time 10 min   Patient Education   Patient Education Yes   Educated in disease process Yes   Educated in home exercise program Yes   Time Calculation   PT Timed Codes 45 min   PT Untimed Codes 20 min   PT Unbilled Time 0   PT Total Treatment 65   Sharnita Bogucki [place], PTA

## 2020-02-17 NOTE — Progress Notes (Signed)
Physical Therapy Exercise Flowsheet:  *Please refer to Physical Therapy Daily Flowsheet for further details of this session.*     02/17/20 1000   Cervical Exercises   Cervical Exercises Yes   Active Cervical Sideflexion Comment 10 sec, 5 reps   Cervical Retraction, Sitting Comment 10 reps, 5 sec   Levator Scapular Stretch Comment 10 sec, 5 reps   Upper Trapezius Stretch, Sitting Comment 10 sec, 5 reps   Addtional Exercises SNAG, cervical extension, 10 reps, 5 sec   additional exercise SNAG, cervical rotation, 10 reps, 5 sec   Total time 15 min   Shoulder Exercises   Shoulder Exercises Yes   Shoulder External Rotation, Theraband Comment level 2,10 reps, 5 sec   Shoulder Flexion, Theraband Comment level 2, 10 reps, 5 sec   Shoulder Extension, Theraband Comment level 2, 10 reps, 5 sec   Shoulder Internal Rotation, Theraband Comment level 2, 10 reps ,5 sec   Total time 10 min   Manual Therapy   Manual Therapy Yes   Suboccipital Release Comments yes   Myofacial Release Comments upper trap, scalene, levator, rhomboid   Total time 20 min   Blaize Nipper Eulis Foster, PTA

## 2020-02-22 ENCOUNTER — Ambulatory Visit: Payer: No Typology Code available for payment source | Admitting: Physical Medicine and Rehabilitation

## 2020-02-22 ENCOUNTER — Encounter: Payer: Self-pay | Admitting: Physical Medicine and Rehabilitation

## 2020-02-22 ENCOUNTER — Ambulatory Visit: Payer: No Typology Code available for payment source | Attending: Physical Medicine and Rehabilitation

## 2020-02-22 VITALS — BP 151/70 | HR 53 | Ht 64.0 in | Wt 172.0 lb

## 2020-02-22 DIAGNOSIS — S161XXD Strain of muscle, fascia and tendon at neck level, subsequent encounter: Secondary | ICD-10-CM | POA: Insufficient documentation

## 2020-02-22 DIAGNOSIS — M25552 Pain in left hip: Secondary | ICD-10-CM | POA: Insufficient documentation

## 2020-02-22 DIAGNOSIS — M1610 Unilateral primary osteoarthritis, unspecified hip: Secondary | ICD-10-CM

## 2020-02-22 DIAGNOSIS — M7918 Myalgia, other site: Secondary | ICD-10-CM

## 2020-02-22 DIAGNOSIS — G8929 Other chronic pain: Secondary | ICD-10-CM | POA: Insufficient documentation

## 2020-02-22 DIAGNOSIS — M533 Sacrococcygeal disorders, not elsewhere classified: Secondary | ICD-10-CM

## 2020-02-22 DIAGNOSIS — M5481 Occipital neuralgia: Secondary | ICD-10-CM

## 2020-02-22 NOTE — Progress Notes (Signed)
Physical Therapy Daily Flowsheet:  *Please see Physical Therapy Exercise Flowsheet for details regarding exercises completed this session.*     02/22/20 1030   Overview   Diagnosis neck pain   Insurance MVP   Script Date 02/05/20   Visit # 7   Additional Comments Patient reports she just saw MD and just recieved trigger point injection.   Pain Assessment   Pain X    0-10 Scale 7   Pain Location Neck   Modalities   Modalities Yes   Cold Pack Yes   Time 10 min   Moist Heat Yes   Time 10 min   Patient Education   Patient Education Yes   Educated in disease process Yes   Educated in home exercise program Yes   Time Calculation   PT Timed Codes 37 min   PT Untimed Codes 20 min   PT Unbilled Time 0   PT Total Treatment 57   Shakeerah Gradel [place], PTA

## 2020-02-22 NOTE — Progress Notes (Signed)
Physical Therapy Exercise Flowsheet:  *Please refer to Physical Therapy Daily Flowsheet for further details of this session.*     02/22/20 1030   Cervical Exercises   Cervical Exercises Yes   Active Cervical Sideflexion Comment 10 sec, 5 rep   Cervical Retraction, Sitting Comment 10 reps, 5 sec   Levator Scapular Stretch Comment 10 sec, 5 reps   Total time 7 min   Shoulder Exercises   Shoulder Exercises Yes   Shoulder External Rotation, Theraband Comment level 2, 10 reps, 5 sec   Shoulder Flexion, Theraband Comment level 2, 10 reps, 5 sec   Shoulder Extension, Theraband Comment level 2, 10 reps, 5 sec   Shoulder Internal Rotation, Theraband Comment level 2, 10 reps, 5 sec   Total time 10 min   Manual Therapy   Manual Therapy Yes   Suboccipital Release Comments yes   Myofacial Release Comments upper trap, scalene, levator, rhomboid   Total time 20 min   Faythe Heitzenrater Eulis Foster, PTA

## 2020-02-23 NOTE — Progress Notes (Signed)
Department of Physical Medicine & Rehabilitation  Physical Therapy Progress Note    HISTORY:  Diagnosis:    Diagnosis: Neck pain and Hip pain   Date of Surgery/Onset Date of Symptoms:        01/07/2020  Referring Provider:        Buford Dresser, MD  Frequency:    Once a week  Attendance:    Consistent  Patient's compliance with therapy and home exercise program:      good   Treatment:    AROM/PROM/Therapeutic exercise, Cold pack, Flexibility, Heat, Home exercise program instructions/Patient education, Myofascial release, Patient/Family Education, Postural training/body Dealer education, Strengthening    SUBJECTIVE:   Lynn Wagner is a 64 y.o. female presenting to therapy with a chief complaint of hip pain. She states the pain came on ~ 4 weeks ago which has been getting worse with yard work. She describes the pain as burning cramping and gives out with increase activity such as yard work. She denies numbness/tingling . She reports due to the pain she as difficulties with carrying >/= 15 lbs, bending at the waist,  going down heel, stairs doing down. To ease the pain she reports resting and taking over the counter medication.     Sports/Activities/Functional limitations:  Carrying >/= 15 lbs, bending at the waist,  going down heel, stairs doing down    Pain:     Improved                0-10 Scale: 6              Pain Location: Neck              Today: 2-3/10   Best: 1/10   Worst: 8-9/10      Location: Anterior hip   OBJECTIVE:  ROM:    ROM   Flexion:  (22 degrees)   Extension:  (40 degrees)   Right Lateral Flexion:  (30 degrees)   Left Lateral Flexion:  (15 degrees)   Right Rotation:  (72 degrees)   Left Rotation:  (60 degrees)  Strength:    STRENGTH   R Shoulder Flexion : 4+/5, 5/5   R Shoulder Internal Rotation : 5/5   R Shoulder External Rotation (STR): 5/5   L Shoulder Flexion : 4+/5   L Shoulder Internal Rotation : 5/5   L Shoulder External Rotation: 4+/5  Function:   Improving with: reaching up  into cupboards, table tops activities  Palpation: Tenderness to palpation at L greater trochanter       HIP ROM  Left Right    Flexion  [facility]   Extension  [facility] WFL   IR WFL*  WFL   ER WFL* WFL   Abduction WFL WFL   KNEE ROM Left Right   Flexion WFL WFL   Extension WFL WFL     *Pain at end range     KNEE STRENGTH Left Right   Flexion 4 4   Extension 4 4   HIP STRENGTH     Adduction 4- 4   Abduction 4- 4-   Flexion 4 4   Extension 4 4   ANKLE STRENGTH     Dorsiflexion 4+ 4+   Plantarflexion 4+ 4+     Flexibility:   Hip internal rotators: moderate tightness  Hip external rotators: moderate tightness        HIP SPECIAL TESTS Left Right   Hip scour - Corky Sox  + -  Thomas test      Ober's     90/90 hamstring                 Gait: No deficit noted     Still difficulty with the following: pain with looking up activities, turning head side to side with driving and difficulty sleeping at night  Functional Outcome Measures:    Self-Reported:     Disability of arm, shoulder and hand (DASH):  ;  ;     Lower Limb Functional Scale:  44/80   Neck Disability Index (NDI): Neck Disability Index Score: 32   Oswestry(Low Back Pain and Disability Index):          Patient Education   Patient Education: Yes   Educated in disease process: Yes  Educated in home exercise program: Yes     Progress Towards Previous Goals: good           Assessment: Lynn Wagner is a 64 y.o. female presenting to therapy with a chief complaint of neck pain and new onset of L hips.  She has been attending therapy for approximately 7 weeks first for her neck pain to good effect. At this time an assessment was done on her L hip due to increase of pain which has gotten worse this past month. Resulting in activity limitations including transfers, stair navigation, carrying >/= 15 lbs, bending at the waist. Overall, she is progressing well towards her goals, and all goals remain appropriate at this time. Skilled therapy services remain necessary to address  impairments and goals stated below with the exception of two updated goals pertaining to her L hip. Skilled physical therapy services indicated to maximize independence with functional mobility and address goals below.     Rehab potential/prognosis: good  Patient's understanding: good    Patient's goal:  To be able to swing a golf club      Short term goals (4 weeks):  1. Initiate HEP - Achieved  2. Independent with home thermal agents - Achieved  3. NDI </= 20% - on going  4. Patient will restore cervical ROM by 25% in all planes to improve tolerance to activities. - on going     Long term goals (8 weeks): on going  1. Independent with final HEP   2. NDI </= 15%   3. Patient will be able to perform overhead activities for 10 minutes without reports of neck pain.   4. Patient will restore cervical ROM to [facility] in all planes to improve tolerance to activities.   5.  Patient will achieve >/= 12 repetitions on 30 second chair stand test   6. Patient will improve LEFS score by at least 9 points indicating minimal detectable change         Thank you for the referral.  If you have any questions and/or concerns, please feel free to contact me at [phone].        Plan: Continue PT  Aleah Ahlgrim PT, DPT.

## 2020-02-24 ENCOUNTER — Ambulatory Visit: Payer: No Typology Code available for payment source | Admitting: Physical Therapy

## 2020-02-24 DIAGNOSIS — S161XXD Strain of muscle, fascia and tendon at neck level, subsequent encounter: Secondary | ICD-10-CM

## 2020-02-24 DIAGNOSIS — G8929 Other chronic pain: Secondary | ICD-10-CM

## 2020-02-24 DIAGNOSIS — M25552 Pain in left hip: Secondary | ICD-10-CM

## 2020-02-24 NOTE — Progress Notes (Signed)
Physical Therapy Daily Flowsheet:  *Please see Physical Therapy Exercise Flowsheet for details regarding exercises completed this session.*     02/24/20 1300   Overview   Diagnosis Neck pain and Hip pain    Insurance MVP   Script Date 02/05/20   Visit # 8   Additional Comments See PN and assessment of the L hip for details    Functional Outcome Measures   Lower Limb Functional Scale Yes   Any of your usual work, housework or school activities 2   Your usual hobbies, recreational or sporting activities 1   Getting into or out of the bath 4   Walking between rooms 4   Putting on your shoes 4   Squatting 2   Lifting an object like a bag of groceries from the floor 4   Performing light activites around your home 3   Performing heavy activites around your home 1   Getting in or out of the car 4   Walking 2 blocks 2   Walking a mile 2   Going up or down 10 stairs (about 1 flight) 2   Standing for 1 hour 2   Sitting for 1 hour 4   Running on even ground 0   Running on uneven ground 0   Making sharp turns while running fast 0   Hopping 1   Rolling over in bed 2   Lower Limb Functional Score 44   Gait Speed Yes   Distance(meters) 10   Time(sec) 8.63   Meters/Sec 1.16   Patient Education   Patient Education Yes   Educated in disease process Yes   Educated in home exercise program Yes   Time Calculation   PT Timed Codes 30   PT Untimed Codes 0   PT Unbilled Time 0   PT Total Treatment 30     Amire Leazer PT, DPT.

## 2020-02-24 NOTE — Progress Notes (Signed)
Physical Therapy Exercise Flowsheet:  *Please refer to Physical Therapy Daily Flowsheet for further details of this session.*     02/24/20 1500   Cervical Exercises   Active Cervical Sideflexion Comment Review   Cervical Retraction, Sitting Comment Review   Levator Scapular Stretch Comment Review   Turtle Exercise Comment Review   Upper Trapezius Stretch, Sitting Comment Review   Addtional Exercises Review   additional exercise Review   Shoulder Exercises   Shoulder External Rotation, Theraband Comment Review   Shoulder Flexion, Theraband Comment Review   Shoulder Extension, Theraband Comment Review   Shoulder Internal Rotation, Theraband Comment Review   Scapular Retraction, Standing Comment Review   Shoulder Circles Comment Review   Hip Exercises   Hip Abduction Clam, Sidelying Comment 10x3secs; level 3 band   Hip Adductor Set, Supine Comment 10x3secs   additional exercise 10x3secs figure 4 stretch    Manual Therapy   Suboccipital Release Comments Review   Myofacial Release Comments PN measurements completed   Total time 30 minutes      Nadalyn Deringer PT, DPT.

## 2020-02-26 NOTE — Progress Notes (Signed)
Dear Dr. Esmeralda Wagner, [place], MBBS:    History of present illness: Lynn Wagner has been seen recently and treated conservatively for a chief complaint of primarily axial neck pain and bilateral occipital headaches.  Patient denies any radiating symptoms to the upper extremities. Patient states symptoms continue to progress despite conservative care including physical rehabilitation measures as well as oral analgesics.  Patient reports the quality of symptoms vary from aching, throbbing, and stabbing and reports the intensity of pain up to 9/10 but is worsening.  Patient reports exacerbating factors include cervical extension and rotation.  Patient reports mitigating factors include heat and lying down.  Of note, the patient has a secondary complaint of left lateral buttock and groin pain.    Social/functional history: Patient reports limitations with activity level, hobbies, and sleep due to pain.    Review of systems: [name] denies any bowel or bladder dysfunction, constitutional symptoms, or progressive upper extremity weakness.     Physical examination/focused musculoskeletal/spine examination:    Vitals:    02/22/20 0919   BP: 151/70   Pulse: 53   Weight: 78 kg (172 lb)   Height: 1.626 m (5\' 4" )       Alert awake appropriate female patient in no acute distress.    Range of motion of the shoulders are nonpainful bilaterally. Shoulder impingement maneuvers were not pain provoking bilaterally.  Spurling's maneuver does not cause any upper extremity pain.    Palpation of the cervical spinous processes, paraspinal muscles and bilateral occipital regions elicits diffuse tenderness with active palpatory trigger points/taut bands in the bilateral trapezius muscles which on palpation elicit a focal twitch response.  Focal palpation the bilateral occipital regions reproduces the patient's bilateral occipital headaches.    Patrick's maneuver was painful on the left, nonpainful on the right.  Range of motion  of the left hip does elicit left groin pain.    Strength is intact 5 out 5 upper extremities bilaterally throughout all cervical myotomes.      Cervical spine range of motion is limited in extension, rotation, and side bending due to neck pain.      Radiologic review:    1.  X-rays of the lumbar spine dated February 16, 2020 was personally reviewed in the office today demonstrating severe degenerative joint disease at L5-S1 moderate to severe at L4-L5 greatest posteriorly.    2.  X-rays of left hip dated February 16, 2020 was personally reviewed in the office today demonstrating moderate to severe degenerative joint disease the left greater than right hip joints.      Impression:    #1.  Worsening axial neck pain (cervicalgia) and bilateral occipital headaches secondary to bilateral occipital neuralgia secondary to regional myofascial pain syndrome and active bilateral trapezius muscle trigger point syndrome.    2.  Secondary complaint of left lateral buttock and groin pain likely secondary to degenerative left hip joint etiology and/or left sacroiliac joint syndrome etiology    Therapeutic plan:    #1.  Ranelle Oyster and I discussed the importance of continuing outpatient and home physical therapy/rehabilitation measures continuing to focus on an active dynamic cervical spine stabilization program focusing on postural retraining exercises, core strengthening, stretching, conditioning, and range of motion exercises.    #2.  The patient was instructed to avoid unnecessary cervical extension.    #3.  The patient underwent a therapeutic bilateral occipital nerve injection the office today.  Please see the attached procedure note for details.    #  4. The patient underwent a therapeutic bilateral trapezius muscle trigger point injection today in the office.  Please see the attached procedure note for details.    #5.  The patient will followup in my office after the aformentioned continued physical rehabilitation efforts and  today's interventional procedures in 3-4 weeks to assess progress and discuss further options if needed.    Thank you for allowing me to participate in the care your patient, Lynn Wagner.  Please feel free to contact me with any questions you may have.    Sincerely,    Colinda Barth K. Sianni Cloninger M.D.    PROCEDURE NOTE:    Date: 02/22/2020   Patient: Lynn Wagner   Record Number: [id number]   Physician:  Hermenia Bers, MD   Side: R and L   Procedure: Occipital Nerve Injection  Reason for procedure: Occipital Neuralgia     Procedure: The purpose and details of the procedure as well as the risks, benefits and potential complications were explained to the patient.  Verbal consent was obtained.      The patient was placed in a prone position on the table.  The patient chin was flexed.  The skin overlying the upper posterior neck, and occiput were prepped in the usual sterile manner.  The skin overlying the right and left occipital artery was identified by palpation.  Then, on each of the right and left sides, 25 gauge 5/8 inch needles were then inserted superficially just medial to the right and left occipital artery at the level of the superior nuchal line.  Then, after aspiration verified the right and left needle tips were not in a blood vessel, 3 cc of 1% Lidocaine was injected and anesthesia of the right and left occipital nerves was achieved.  The right and left needles were removed, the patients needle sites were dressed with bandages, and the patient was discharged without complication.      Hermenia Bers, MD        PROCEDURE NOTE:    Date: 02/22/2020   Patient: Lynn Wagner   Record Number: [id number]   Physician:  Hermenia Bers, MD   Side/Muscle: R and L trapezius muscle  Procedure: Intra-muscular trigger point injection  Reason for procedure: Myofascial Pain Syndrome     Procedure: The purpose and details of the procedure as well as the risks, benefits and potential complications were explained to the patient.  Verbal consent  was obtained.      The patient was placed in a prone position on the table.  The cervical region, posterior neck, and periscapular region was prepped and draped in the usual sterile manner.  On the right and left side, the skin overlying each trigger point of was identified, and a skin wheal was raised with 1% Xylocaine.  Then, on each of the right and left sides, a 25 gauge 3  inch needle was then utilized to pierce the taut band of muscle tissue and repeated to mechanically break up the taut band.  Simultaneously, 2 cc of 1% Lidocaine was injected after aspiration verified that the needle tip was not in a blood vessel.    The patient was then dressed with a bandage and discharged without complication.      Hermenia Bers, MD

## 2020-02-29 ENCOUNTER — Ambulatory Visit: Payer: No Typology Code available for payment source

## 2020-02-29 DIAGNOSIS — S161XXD Strain of muscle, fascia and tendon at neck level, subsequent encounter: Secondary | ICD-10-CM

## 2020-02-29 NOTE — Progress Notes (Signed)
Physical Therapy Daily Flowsheet:  *Please see Physical Therapy Exercise Flowsheet for details regarding exercises completed this session.*     02/29/20 0820   Overview   Diagnosis Neck pain and Hip pain    Insurance MVP   Script Date 02/05/20   Visit # 9   Pain Assessment   Pain X    0-10 Scale 1   Pain Location Neck   Modalities   Moist Heat Yes   Time 10 min   Patient Education   Patient Education Yes   Educated in disease process Yes   Educated in home exercise program Yes   Time Calculation   PT Timed Codes 49 min   PT Untimed Codes 10 min   PT Unbilled Time 0   PT Total Treatment 59   Sharicka Pogorzelski [place], PTA

## 2020-02-29 NOTE — Progress Notes (Signed)
Physical Therapy Exercise Flowsheet:  *Please refer to Physical Therapy Daily Flowsheet for further details of this session.*     02/29/20 0827   Gym Equipment Exercises   Gym Equipment Exercises Yes   Nustep Comments level 4, 8 min   Total time 8 min   Cervical Exercises   Cervical Exercises Yes   Active Cervical Sideflexion Comment 10 sec, 5 reps   Cervical Retraction, Sitting Comment 10 reps, 5 sec   Levator Scapular Stretch Comment 10 sec, 5 reps   Total time 7 min   Shoulder Exercises   Shoulder Exercises Yes   Shoulder External Rotation, Theraband Comment level 2, 10 reps, 5 sec   Shoulder Flexion, Theraband Comment level 2 10 reps, 5 sec   Shoulder Extension, Theraband Comment level 2, 10 reps, 5 sec   Shoulder Internal Rotation, Theraband Comment level 2, 10 reps 5 sec,    Scapular Retraction, Standing Comment level 2, 10 reps, 5 sec   Total time 10 min   Lumbar/Abdominal Exercises   Lumbar/Abdominal Exercises Yes   Bridges w/ Pillow Squeeze Comment 10 reps, 5 sec   Total time 2 min   Hip Exercises   Hip Exercises Yes   Hamstring Stretch, Supine Comment with strap, 10 reps, 5 sec   Hip Abduction Clam, Sidelying Comment level 3, 10 reps, 5 sec   additional exercise figure 4, 10 reps, 5 sec   additional exercise SLR, 10 reps, 5 sec   Total time 10 min   Manual Therapy   Manual Therapy Yes   Suboccipital Release Comments yes   Myofacial Release Comments upper trap, scalene, levator, rhomboid   Total time 20 min   Jaquail Mclees Eulis Foster, PTA

## 2020-03-07 ENCOUNTER — Ambulatory Visit: Payer: No Typology Code available for payment source

## 2020-03-07 DIAGNOSIS — G8929 Other chronic pain: Secondary | ICD-10-CM

## 2020-03-07 DIAGNOSIS — M25552 Pain in left hip: Secondary | ICD-10-CM

## 2020-03-07 DIAGNOSIS — S161XXD Strain of muscle, fascia and tendon at neck level, subsequent encounter: Secondary | ICD-10-CM

## 2020-03-07 NOTE — Progress Notes (Signed)
Physical Therapy Daily Flowsheet:  *Please see Physical Therapy Exercise Flowsheet for details regarding exercises completed this session.*     03/07/20 0914   Overview   Diagnosis Neck pain and Hip pain    Insurance MVP   Script Date 02/05/20   Visit # 10   Pain Assessment   Pain X    0-10 Scale 3   Pain Location Neck   Modalities   Moist Heat Yes   Time 10 min   Patient Education   Patient Education Yes   Educated in disease process Yes   Educated in home exercise program Yes   Time Calculation   PT Timed Codes 62 min   PT Untimed Codes 10 min   PT Unbilled Time 0   PT Total Treatment 72   Joliana Claflin [place], PTA

## 2020-03-07 NOTE — Progress Notes (Signed)
Physical Therapy Exercise Flowsheet:  *Please refer to Physical Therapy Daily Flowsheet for further details of this session.*     03/07/20 0914   Gym Equipment Exercises   Gym Equipment Exercises Yes   Nustep Comments level 4, 10 min   Total time 10 min   Cervical Exercises   Cervical Exercises Yes   Active Cervical Sideflexion Comment 10 sec, 5 reps   Cervical Retraction, Sitting Comment 10 reps, 5 sec   Levator Scapular Stretch Comment 10 sec, 5 reps   Total time 5 min   Shoulder Exercises   Shoulder Exercises Yes   Shoulder External Rotation, Theraband Comment level 3, 10 reps, 5 sec   Shoulder Flexion, Theraband Comment level 3, 10 reps, 5 sec   Shoulder Extension, Theraband Comment level 3, 10 rpes, 5 sec   Shoulder Internal Rotation, Theraband Comment level 3, 10 reps, 5 sec   Scapular Retraction, Standing Comment level 3 ,10 reps, 5 sec   Total time 10 min   Lumbar/Abdominal Exercises   Lumbar/Abdominal Exercises Yes   Bridges w/ Pillow Squeeze Comment 10 reps, 5 sec   Total time 2 min   Hip Exercises   Hip Exercises Yes   Hamstring Stretch, Supine Comment with strap, 10 reps, 5 sec   Hip Abduction Clam, Sidelying Comment level 3, 10 reps ,5 sec   Hip Abduction, Standing Comment 10 reps, 5 sec   additional exercise SLR, 10 reps, 5 sec, B   additional exercise figure 4, 10 reps, 5 sec   Total time 15 min   Manual Therapy   Manual Therapy Yes   Suboccipital Release Comments yes   Myofacial Release Comments upper trap, scalene, levator, rhomboid   Total time 20 min   Jamason Peckham Eulis Foster, PTA

## 2020-03-14 ENCOUNTER — Ambulatory Visit: Payer: No Typology Code available for payment source

## 2020-03-14 ENCOUNTER — Encounter: Payer: Self-pay | Admitting: Physical Medicine and Rehabilitation

## 2020-03-14 ENCOUNTER — Ambulatory Visit: Payer: No Typology Code available for payment source | Admitting: Physical Medicine and Rehabilitation

## 2020-03-14 VITALS — BP 179/80 | HR 47 | Ht 64.0 in | Wt 172.0 lb

## 2020-03-14 DIAGNOSIS — M25552 Pain in left hip: Secondary | ICD-10-CM

## 2020-03-14 DIAGNOSIS — S161XXD Strain of muscle, fascia and tendon at neck level, subsequent encounter: Secondary | ICD-10-CM

## 2020-03-14 DIAGNOSIS — G8929 Other chronic pain: Secondary | ICD-10-CM

## 2020-03-14 DIAGNOSIS — M7918 Myalgia, other site: Secondary | ICD-10-CM

## 2020-03-14 DIAGNOSIS — M7060 Trochanteric bursitis, unspecified hip: Secondary | ICD-10-CM

## 2020-03-14 DIAGNOSIS — M1610 Unilateral primary osteoarthritis, unspecified hip: Secondary | ICD-10-CM

## 2020-03-14 DIAGNOSIS — M5481 Occipital neuralgia: Secondary | ICD-10-CM

## 2020-03-14 NOTE — Progress Notes (Signed)
Physical Therapy Daily Flowsheet:  *Please see Physical Therapy Exercise Flowsheet for details regarding exercises completed this session.*     03/14/20 1000   Overview   Diagnosis Neck pain and Hip pain    Insurance MVP   Script Date 02/05/20   Visit # 11   Pain Assessment   Pain X    0-10 Scale 4   Pain Location Neck   Modalities   Moist Heat Yes   Time 10 min   Patient Education   Patient Education Yes   Educated in disease process Yes   Educated in home exercise program Yes   Time Calculation   PT Timed Codes 53 min   PT Untimed Codes 10 min   PT Unbilled Time 0   PT Total Treatment 63   Cathyann Kilfoyle [place], PTA

## 2020-03-14 NOTE — Progress Notes (Signed)
Physical Therapy Exercise Flowsheet:  *Please refer to Physical Therapy Daily Flowsheet for further details of this session.*     03/14/20 1059   Gym Equipment Exercises   Gym Equipment Exercises Yes   Nustep Comments level 4, 10 min   Total time 10 min   Cervical Exercises   Cervical Exercises Yes   Active Cervical Sideflexion Comment 10 sec, 5 reps   Levator Scapular Stretch Comment 10 sec, 5 reps   Turtle Exercise Comment SNAG, with towel, cervical rotation, 10 reps, 5 sec   Total time 6 min   Lumbar/Abdominal Exercises   Lumbar/Abdominal Exercises Yes   Bridges w/ Pillow Squeeze Comment 10 reps, 5 sec   Total time 2 min   Hip Exercises   Hip Exercises Yes   Hip Abduction Clam, Sidelying Comment level 3, 10 reps, 5 sec   Hip Abduction, Standing Comment 10 reps, 5 sec, B   additional exercise SLR, 10 reps, 5 sec   additional exercise figure 4, 10 reps, 5 sec   Total time 15 min   Manual Therapy   Manual Therapy Yes   Suboccipital Release Comments yes   Myofacial Release Comments upper trap, scalene, levator, rhomboid   Total time 20 min   Christoper Bushey Eulis Foster, PTA

## 2020-03-14 NOTE — Progress Notes (Signed)
Dear Dr. Tish Men, [place], MBBS:    History of present illness: Lynn Wagner has been seen recently and treated conservatively for a chief complaint of primarily axial neck pain and bilateral occipital headaches.  Patient denies any radiating symptoms to the upper extremities. Patient states symptoms continue to progress despite conservative care including physical rehabilitation measures as well as oral analgesics.  Patient reports the quality of symptoms vary from aching, throbbing, and stabbing and reports the intensity of pain up to 9/10 but is worsening.  Patient reports exacerbating factors include cervical extension and rotation.  Patient reports mitigating factors include heat and lying down.  Of note, the patient also reports left hip issues, including left lateral buttock and groin pain.    Social/functional history: Patient reports limitations with activity level, hobbies, and sleep due to pain.    Review of systems: Lynn Wagner denies any bowel or bladder dysfunction, constitutional symptoms, or progressive upper extremity weakness.     Physical examination/focused musculoskeletal/spine examination:    Vitals:    03/14/20 0915   BP: 179/80   Pulse: (!) 47   Weight: 78 kg (172 lb)   Height: 1.626 m (5\' 4" )       Alert awake appropriate female patient in no acute distress.    Palpation of the cervical spinous processes, paraspinal muscles and bilateral occipital regions elicits diffuse tenderness with active palpatory trigger points/taut bands in the bilateral trapezius muscles which on palpation elicit a focal twitch response.  Focal palpation the bilateral occipital regions reproduces the patient's bilateral occipital headaches.    Strength is intact 5 out 5 upper extremities bilaterally throughout all cervical myotomes.      Cervical spine range of motion is limited in extension, rotation, and side bending due to neck pain.    Impression:    #1.  Worsening axial neck pain (cervicalgia) and  bilateral occipital headaches secondary to bilateral occipital neuralgia secondary to regional myofascial pain syndrome and active bilateral trapezius muscle trigger point syndrome.    2.  Left lateral buttock and groin pain most likely secondary to 1 or combination of left hip degenerative joint disease/OA and/or left trochanteric bursitis    Therapeutic plan:    #1.  and I discussed the importance of continuing outpatient and home physical therapy/rehabilitation measures continuing to focus on an active dynamic cervical spine stabilization program focusing on postural retraining exercises, core strengthening, stretching, conditioning, and range of motion exercises.    #2.  The patient was instructed to avoid unnecessary cervical extension.    #3.  The patient underwent a therapeutic bilateral occipital nerve injection the office today.  Please see the attached procedure note for details.    #4. The patient underwent a therapeutic bilateral trapezius muscle trigger point injection today in the office.  Please see the attached procedure note for details.    #5.  The patient will followup in my office after the aformentioned continued physical rehabilitation efforts and today's interventional procedures in 3-4 weeks to assess progress and discuss further options if needed.    6.  Patient was referred to my partner, Dr. Hinda Lenis, MD, for further evaluation of patient's left hip joint/trochanteric bursa region pain    Thank you for allowing me to participate in the care your patient, Lynn Wagner.  Please feel free to contact me with any questions you may have.    Sincerely,    Kenyetta Wimbish K. Melat Wrisley M.D.    PROCEDURE NOTE:  Date: 03/14/2020   Patient: Lynn Wagner   Record Number: [id number]   Physician:  Buford Dresser, MD   Side: R and L   Procedure: Occipital Nerve Injection  Reason for procedure: Occipital Neuralgia     Procedure: The purpose and details of the procedure as well as the risks, benefits  and potential complications were explained to the patient.  Verbal consent was obtained.      The patient was placed in a prone position on the table.  The patient chin was flexed.  The skin overlying the upper posterior neck, and occiput were prepped in the usual sterile manner.  The skin overlying the right and left occipital artery was identified by palpation.  Then, on each of the right and left sides, 25 gauge 5/8 inch needles were then inserted superficially just medial to the right and left occipital artery at the level of the superior nuchal line.  Then, after aspiration verified the right and left needle tips were not in a blood vessel, 3 cc of 1% Lidocaine was injected and anesthesia of the right and left occipital nerves was achieved.  The right and left needles were removed, the patients needle sites were dressed with bandages, and the patient was discharged without complication.      Buford Dresser, MD        PROCEDURE NOTE:    Date: 03/14/2020   Patient: Lynn Wagner   Record Number: [id number]   Physician:  Buford Dresser, MD   Side/Muscle: R and L trapezius muscle  Procedure: Intra-muscular trigger point injection  Reason for procedure: Myofascial Pain Syndrome     Procedure: The purpose and details of the procedure as well as the risks, benefits and potential complications were explained to the patient.  Verbal consent was obtained.      The patient was placed in a prone position on the table.  The cervical region, posterior neck, and periscapular region was prepped and draped in the usual sterile manner.  On the right and left side, the skin overlying each trigger point of was identified, and a skin wheal was raised with 1% Xylocaine.  Then, on each of the right and left sides, a 25 gauge 3  inch needle was then utilized to pierce the taut band of muscle tissue and repeated to mechanically break up the taut band.  Simultaneously, 2 cc of 1% Lidocaine was injected after aspiration verified that the  needle tip was not in a blood vessel.    The patient was then dressed with a bandage and discharged without complication.      Buford Dresser, MD

## 2020-03-22 ENCOUNTER — Ambulatory Visit: Payer: No Typology Code available for payment source | Attending: Physical Medicine and Rehabilitation

## 2020-03-22 DIAGNOSIS — M25552 Pain in left hip: Secondary | ICD-10-CM | POA: Insufficient documentation

## 2020-03-22 DIAGNOSIS — S161XXD Strain of muscle, fascia and tendon at neck level, subsequent encounter: Secondary | ICD-10-CM | POA: Insufficient documentation

## 2020-03-22 DIAGNOSIS — G8929 Other chronic pain: Secondary | ICD-10-CM | POA: Insufficient documentation

## 2020-03-22 NOTE — Progress Notes (Signed)
Physical Therapy Daily Flowsheet:  *Please see Physical Therapy Exercise Flowsheet for details regarding exercises completed this session.*     03/22/20 0928   Overview   Diagnosis Neck pain and Hip pain    Insurance MVP   Script Date 02/05/20   Visit # 12   Pain Assessment   Pain X    0-10 Scale 3   Pain Location Neck   Modalities   Moist Heat Yes   Time 10 min   Patient Education   Patient Education Yes   Educated in disease process Yes   Educated in home exercise program Yes   Time Calculation   PT Timed Codes 58 min   PT Untimed Codes 10 in   PT Unbilled Time 0   PT Total Treatment 68   Lynn Wagner [place], PTA

## 2020-03-22 NOTE — Progress Notes (Signed)
Physical Therapy Exercise Flowsheet:  *Please refer to Physical Therapy Daily Flowsheet for further details of this session.*     03/22/20 0930   Gym Equipment Exercises   Gym Equipment Exercises Yes   Nustep Comments level 4, 10 min   Total time 10 min   Cervical Exercises   Cervical Exercises Yes   Active Cervical Sideflexion Comment 10 sec, 5 reps   Levator Scapular Stretch Comment 10 sec, 5 reps   Turtle Exercise Comment SNAG, with towel, cervical rotation, 10 reps, 5 sec   Total time 6 min   Lumbar/Abdominal Exercises   Lumbar/Abdominal Exercises Yes   Bridges w/ Pillow Squeeze Comment 15 reps, 5 sec   Total time 2 min   Hip Exercises   Hip Exercises Yes   Hamstring Stretch, Supine Comment with strap, 10 reps, 5 sec   Hip Abduction, Standing Comment level 2, 10 reps, 5 sec, B   Hip Extension, Standing Comment level 2, 10 reps, 5 sec, B   additional exercise SLR, level 2, 10 reps, 5 sec, B   additional exercise figure 4, 10 reps, 5 sec   additional exercise mini squat, 10 reps   Total time 20 min   Manual Therapy   Manual Therapy Yes   Suboccipital Release Comments yes   Myofacial Release Comments upper trap, scalene, levator   Total time 20 min   Captola Teschner Eulis Foster, PTA

## 2020-03-23 NOTE — Progress Notes (Addendum)
Department of Physical Medicine & Rehabilitation  Physical Therapy Progress Note    HISTORY:  Diagnosis:    Diagnosis: Neck pain and Hip pain   Date of Surgery/Onset Date of Symptoms:        01/07/2020  Referring Provider:        Lenard Simmer, MD  Frequency:    Once a week  Attendance:    Consistent  Patient's compliance with therapy and home exercise program:      good   Treatment:    AROM/PROM/Therapeutic exercise, Cold pack, Flexibility, Heat, Home exercise program instructions/Patient education, Myofascial release, Patient/Family Education, Postural training/body Dealer education, Strengthening    SUBJECTIVE:  Pain:    Improved     0-10 Scale: 3   Pain Location: Neck     OBJECTIVE:  ROM:    ROM   Flexion:  (34 degrees)   Extension:  (45 degrees)   Right Lateral Flexion:  (40 degrees)   Left Lateral Flexion:  (35 degrees)   Right Rotation:  (65 degrees)   Left Rotation:  (65 degrees)  Strength:    STRENGTH   R Shoulder Flexion : 5/5   R Shoulder Internal Rotation : 5/5   R Shoulder External Rotation (STR): 5/5   L Shoulder Flexion : 4+/5, 5/5   L Shoulder Internal Rotation : 5/5   L Shoulder External Rotation: 4+/5  Function:   Improving with: turning head with driving, reaching up into cupboards, table tops activities, less difficulty with sit to stand, decreased pain with ambulation and prolonged sitting and standing    Still difficulty with the following: Pain and difficulty with coming down stairs or walking down hills, pain and tightness in the morning but improving ROM, pain with prolonged looking up      Functional Outcome Measures:    Self-Reported:     Disability of arm, shoulder and hand (DASH):  ;  ;     Lower Limb Functional Scale: Lower Limb Functional Score: 52   Neck Disability Index (NDI): Neck Disability Index Score: 26   Oswestry(Low Back Pain and Disability Index):          Patient Education   Patient Education: Yes   Educated in disease process: Yes   Educated in  home exercise program: Yes       Progress Towards Previous Goals: good             Progress Towards Previous Goals: good     Rehab potential/prognosis:good  Patient's understanding:good  Patient's goal:To be able to swing a golf club  Assessment: Lynn Wagner is a 64 y.o. female presents to physical therapy with a chief complaint of neck and hip pain. Patient reports neck and hip pain are getting better with pain with functions at home. Patient demonstrated improvement with ROM and strength. Patient decreased pain with ambulation and prolonged standing. Patient continues to c/o pain and difficulty with coming down stairs or walking on the hill. Patient would benefit from continued skilled PT to improve strength, ROM and address goals below.    Short term goals(4weeks):  1. Initiate HEP - Achieved  2. Independent with home thermal agents - Achieved  3.NDI</= 20% - partially achieved/progressing  4.Patient will restore cervical ROM by 25% in all planes to improve tolerance to activities.- partially achieved/progressing    Long term goals (8weeks): on going  1. Independent with final HEP - on going  2.NDI</= 15% - on going  3.Patient will be able to perform  overhead activities for 10 minutes without reports of neck pain.- Achieved  4.Patient will restore cervical ROM to [facility] in all planes to improve tolerance to activities.- partially achieved/progressing  5.  Patient will achieve >/= 12 repetitions on 30 second chair stand test - Achieved  6. Patient will improve LEFS score by at least 9 points indicating minimal detectable change  - Achieved      Thank you for the referral. If you have any questions and/or concerns, please feel free to contact me at [phone].        Plan: Continue PT    Sherrielyn Eulis Foster, PTA  Laurella Tull PT, DPT.

## 2020-03-25 ENCOUNTER — Ambulatory Visit: Payer: No Typology Code available for payment source | Admitting: Physical Medicine and Rehabilitation

## 2020-03-28 ENCOUNTER — Ambulatory Visit
Payer: No Typology Code available for payment source | Attending: Physical Medicine and Rehabilitation | Admitting: Physical Medicine and Rehabilitation

## 2020-03-28 ENCOUNTER — Encounter: Payer: Self-pay | Admitting: Physical Medicine and Rehabilitation

## 2020-03-28 VITALS — BP 136/64 | HR 54 | Temp 97.9°F | Ht 64.0 in | Wt 172.0 lb

## 2020-03-28 DIAGNOSIS — M1612 Unilateral primary osteoarthritis, left hip: Secondary | ICD-10-CM | POA: Insufficient documentation

## 2020-03-28 DIAGNOSIS — M25552 Pain in left hip: Secondary | ICD-10-CM | POA: Insufficient documentation

## 2020-03-28 DIAGNOSIS — G8929 Other chronic pain: Secondary | ICD-10-CM | POA: Insufficient documentation

## 2020-03-28 NOTE — Progress Notes (Signed)
Patient: Lynn Wagner   MRN: [id number]  DOB: [birth year]   Date: 03/28/2020   Referring Provider: Hermenia Bers, MD     Chief Complaint: Left hip pain  Subjective   History of Present Illness:  Lynn Wagner is a 64 y.o. female who presents with Left hip pain.     Onset/Duration: Intermittent groin burning pain since January-February 2021, lateral hip pain since April 2021  Mechanism of Injury: Started developing hip pain more consistently in April with increased yard work.  Injury Course: Pain has been intermittent, but episodes have been progressively lasting longer. [facility] by Dr. Allena Katz on 03/14/2020 for axial neck pain and bilateral occipital headaches, and she was referred to me for evaluation of left hip (left lateral buttock and groin) pain.  Location/Quality: Burning pain in the groin, sharp pain in the lateral hip (deep in that area)  Timing: Intermittent  Severity: 2/10  Aggravating/Alleviating Factors: Pain with yard work and shoveling, difficult to bear weight on the left hip, feeling of giving out  Pain with walking downhill, as well as walking down stairs (especially carrying 5-15 pounds)  Pain also worsened with turning to the Left or taking long, quick strides while walking  Associated Symptoms: Denies cracking, popping, snapping and clicking  Symptoms interfere with mobility and activities of daily living.    Prior Treatments:  Home exercise program: as per PT   Physical therapy: 1 month of PT at [facility], some relief. Exercises include clamshell, standing hip abduction, leg swings, SLR, quad squeeze, figure 4 stretch.  Brace/Assistive device: If so, what type?: not tried  Acetaminophen: not tried  NSAIDs: naproxen 2 tabs in the AM (also takes for neck pain), some relief   Topicals: Salonpas for neck but not often for hip  Modalities: cold initially, no relief  Other Medications/Supplements: not tried   Alternative treatments: not tried   Corticosteroid injections: not tried   Other  procedures: not tried   Surgery: not tried     I reviewed the patient's medical history, surgical history, medications, allergies, family history and social history and updated them as appropriate in the electronic medical record.   History of Bilateral partial knee replacements    Review of Systems: Review of systems was performed and was positive for Weight gain, difficulty sleeping due to neck pain, heartburn. All other systems negative.      Objective   Physical Examination:   BP 136/64    Pulse 54    Temp 36.6 C (97.9 F) (Temporal)    Ht 1.626 m (5\' 4" )    Wt 78 kg (172 lb)    BMI 29.52 kg/m      Musculoskeletal:   Hip: Left  Inspection:   Pelvis is symmetric.    Dynamic Test:  Gait: slightly stiff with mild compensated Trendelenburg bilaterally   Assistive devices: None  Single leg squat: Fair neuromuscular control, better on Right side    Palpation:  There is concordant tenderness to palpation of the anterior hip joint on the left. There is discordant tenderness to palpation of the greater trochanter on the left.     ROM:   Flexion: Right 110, Left 110  External rotation: Right 60, Left 60  Internal rotation: Right 25, Left 25    Special Tests:  Straight leg raise: Negative  FABER: Negative  FADIR: Negative  Hip scour: Negative  Ober test: Mild ITB tightness  Hip abduction strength (clamshell) 5/5 on the Left  Isolated gluteus medius strength 4/5 on the Left   Gluteus maximus activation on prone hip extension: Good    Labs:   Personally reviewed patient's prior labs as follows:  Lab Results   Component Value Date    HA1C 5.3 06/30/2019    CREAT 0.86 06/30/2019    AST 17 06/30/2019    ALT 14 06/30/2019    ALB 4.5 06/30/2019      Imaging:   Previous imaging studies:  XR pelvis 02/16/2020 - I personally reviewed the images 03/28/2020 which showed: Degenerative changes in the left hip joint, including joint space narrowing and subchondral sclerosis.     Assessment    Impression:  1. Chronic left hip pain     2. Primary osteoarthritis of left hip    Chronic progressively worsening Left hip pain due to osteoarthritis.     Recommendations:  Education: Educated patient on diagnosis and treatment of hip osteoarthritis.   - Counseled on the importance of weight loss.  Home Exercise Program: Continue home exercise program as per PT.  Recommend low-impact exercise (e.g., walking, biking, elliptical, swimming/pool).   Discussed ACSM/CDC general exercise guidelines.   Modalities: Ice 15 minutes, several times a day as needed and after exercise.  Can alternate with heat for 15 minutes.   Therapies: - Continue physical therapy, counseled on maintaining home exercise program.  Medications:   Discussed judicious use of Tylenol   Acetaminophen (Tylenol): 500 mg 1-2 tablets PO every 6 hours as needed for pain   - Discussed judicious use of over-the-counter NSAIDs.   Naproxen (Aleve) 220 mg: 1-2 tablets PO twice a day PRN  May continue Salonpas or use other OTC topical treatment (e.g., Biofreeze/IcyHot or other menthol-based topical treatment, capsaicin cream, or diclofenac gel)    Return to clinic: PRN  Return if symptoms worsen or fail to improve.     No orders of the defined types were placed in this encounter.

## 2020-03-28 NOTE — Patient Instructions (Signed)
Continue physical therapy and home exercise program.     Recommend low-impact exercise (e.g., walking, biking, elliptical, swimming/pool).     ACSM/CDC guidelines: At least 30 minutes per day of moderate-intensity exercise, 4-5 times per week    Steps:  Start at 6,000 steps per day, then increase by 1,000 steps per day or per week.  Eventual goal is 10,000 steps per day.  Decrease if pain gets worse.    Discomfort during exercise may be normal.  Greater than 2 hours of pain or swelling: requires modification of program.    Discussed importance of weight loss.     Ice 15 minutes, several times a day as needed and after exercise. Can alternate with heat for 15 minutes.     Acetaminophen 500 mg (Tylenol Extra Strength): take 1-2 tablets every 6 hours as needed for pain.  - Do not take more than a total of 3,000 mg (6 Extra Strength tablets) in a 24-hour period.    Appropriate dosing of naproxen (also known as Aleve) is 1-2 tabs twice a day. The bottle will say 1 tab twice a day; for prescription strength level you can take 2 tabs twice a day. Also take with food.  - Tylenol (acetaminophen) is OK to take at the same time; just don't go above 3,000 mg per day (total of 6 extra-strength tabs).    You may use an over-the-counter topical gel, cream, or ointment for pain relief. Some examples include:   - Menthol-based treatment (e.g., Biofreeze, IcyHot)  - Capsaicin cream  - Diclofenac (Voltaren) gel - a non-steroidal anti-inflammatory drug (NSAID)  Apply to the affected area up to 4 times per day.    Follow up in clinic as needed if symptoms worsen or do not improve.

## 2020-03-30 ENCOUNTER — Ambulatory Visit: Payer: No Typology Code available for payment source

## 2020-03-30 DIAGNOSIS — G8929 Other chronic pain: Secondary | ICD-10-CM

## 2020-03-30 DIAGNOSIS — S161XXD Strain of muscle, fascia and tendon at neck level, subsequent encounter: Secondary | ICD-10-CM

## 2020-03-30 DIAGNOSIS — M25552 Pain in left hip: Secondary | ICD-10-CM

## 2020-03-30 NOTE — Progress Notes (Signed)
Physical Therapy Daily Flowsheet:  *Please see Physical Therapy Exercise Flowsheet for details regarding exercises completed this session.*     03/30/20 0928   Overview   Diagnosis Neck pain and Hip pain    Insurance MVP   Script Date 02/05/20   Visit # 13   Pain Assessment   Pain X    0-10 Scale 3   Pain Location Neck   Modalities   Moist Heat Yes   Time 10 min   Patient Education   Patient Education Yes   Educated in disease process Yes   Educated in home exercise program Yes   Time Calculation   PT Timed Codes 60 min   PT Untimed Codes 10 min   PT Unbilled Time 0   PT Total Treatment 70   Lynn Wagner [place], PTA

## 2020-03-30 NOTE — Progress Notes (Signed)
Physical Therapy Exercise Flowsheet:  *Please refer to Physical Therapy Daily Flowsheet for further details of this session.*     03/30/20 0928   Gym Equipment Exercises   Gym Equipment Exercises Yes   Nustep Comments level 4, 10 min   Total time 10 min   Cervical Exercises   Cervical Exercises Yes   Active Cervical Sideflexion Comment 10 sec, 5 reps   Cervical Retraction, Sitting Comment 10 reps, 5 sec   Turtle Exercise Comment SNAG, with towel, 10 reps, 5 sec   Total time 5 min   Lumbar/Abdominal Exercises   Lumbar/Abdominal Exercises Yes   Bridges w/ Pillow Squeeze Comment 15 reps, 5 sec   Total time 3 min   Hip Exercises   Hip Exercises Yes   Hamstring Stretch, Supine Comment with strap, 30 sec, 3 reps, B   Hip Abduction Clam, Sidelying Comment level 3, 15 reps, 5 sec   Hip Abduction, Standing Comment level 2, 10 reps, 5 sec   Hip Extension, Standing Comment level 2, 10 reps, 5 sec, B   additional exercise SLR, level 2, 10 reps, 5 sec, B   additional exercise piriformis stretch, 10 sec, 5 reps   additional exercise mini squat, 10 reps, 2 sets   Total time 22 min   Manual Therapy   Manual Therapy Yes   Suboccipital Release Comments yes   Myofacial Release Comments upper trap, scalene, levator, rhomboid   Total time 20 min   Zeenat Jeanbaptiste Eulis Foster, PTA

## 2020-04-05 ENCOUNTER — Ambulatory Visit: Payer: No Typology Code available for payment source

## 2020-04-05 DIAGNOSIS — G8929 Other chronic pain: Secondary | ICD-10-CM

## 2020-04-05 DIAGNOSIS — M25552 Pain in left hip: Secondary | ICD-10-CM

## 2020-04-05 DIAGNOSIS — S161XXD Strain of muscle, fascia and tendon at neck level, subsequent encounter: Secondary | ICD-10-CM

## 2020-04-05 NOTE — Progress Notes (Signed)
Physical Therapy Exercise Flowsheet:  *Please refer to Physical Therapy Daily Flowsheet for further details of this session.*     04/05/20 1030   Gym Equipment Exercises   Gym Equipment Exercises Yes   Nustep Comments level 4 ,10 min   Total time 10 min   Cervical Exercises   Cervical Exercises Yes   Active Cervical Sideflexion Comment 10 sec, 5 reps   Cervical Retraction, Sitting Comment 10 reps, 5 sec   Upper Trapezius Stretch, Sitting Comment 10 sec, 5 reps   Total time 5 min   Lumbar/Abdominal Exercises   Lumbar/Abdominal Exercises Yes   Bridges w/ Pillow Squeeze Comment 15 reps, 5 sec   Total time 3 min   Hip Exercises   Hip Exercises Yes   Hip Abduction, Standing Comment level 3, 10 reps, 5 sec   Hip Extension, Standing Comment level 3, 10 reps, 5 sec   additional exercise SLR, standing, level 3, 10 reps, 5 sec   additional exercise mini squat, 20 reps   additional exercise SLR, supine, 10 reps ,5 sec   Total time 20 min   Manual Therapy   Manual Therapy Yes   Suboccipital Release Comments yes   Myofacial Release Comments upper trap, scalene, levator, rhomboid   Total time 20 min   Dontel Harshberger Eulis Foster, PTA

## 2020-04-05 NOTE — Progress Notes (Signed)
Physical Therapy Daily Flowsheet:  *Please see Physical Therapy Exercise Flowsheet for details regarding exercises completed this session.*     04/05/20 1024   Overview   Diagnosis Neck pain and Hip pain    Insurance MVP   Script Date 02/05/20   Visit # 14   Pain Assessment   Pain X    0-10 Scale 3   Pain Location Neck   Modalities   Moist Heat Yes   Time 10 min   Patient Education   Patient Education Yes   Educated in disease process Yes   Educated in home exercise program Yes   Time Calculation   PT Timed Codes 58 min   PT Untimed Codes 10 min   PT Unbilled Time 0   PT Total Treatment [address] Melody Hill, PTA

## 2020-04-13 ENCOUNTER — Ambulatory Visit: Payer: No Typology Code available for payment source

## 2020-04-13 ENCOUNTER — Encounter: Payer: Self-pay | Admitting: Physical Medicine and Rehabilitation

## 2020-04-13 ENCOUNTER — Ambulatory Visit: Payer: No Typology Code available for payment source | Admitting: Physical Medicine and Rehabilitation

## 2020-04-13 VITALS — BP 143/67 | HR 56 | Ht 64.0 in | Wt 172.0 lb

## 2020-04-13 DIAGNOSIS — M7918 Myalgia, other site: Secondary | ICD-10-CM

## 2020-04-13 DIAGNOSIS — M25552 Pain in left hip: Secondary | ICD-10-CM

## 2020-04-13 DIAGNOSIS — M5481 Occipital neuralgia: Secondary | ICD-10-CM

## 2020-04-13 DIAGNOSIS — G8929 Other chronic pain: Secondary | ICD-10-CM

## 2020-04-13 DIAGNOSIS — S161XXD Strain of muscle, fascia and tendon at neck level, subsequent encounter: Secondary | ICD-10-CM

## 2020-04-13 NOTE — Progress Notes (Signed)
Physical Therapy Exercise Flowsheet:  *Please refer to Physical Therapy Daily Flowsheet for further details of this session.*     04/13/20 1000   Gym Equipment Exercises   Gym Equipment Exercises Yes   Nustep Comments level 4, 10 min   Total time 10 min   Cervical Exercises   Cervical Exercises Yes   Active Cervical Sideflexion Comment 10 sec, 5 reps   Cervical Retraction, Sitting Comment 10 reps, 5 sec   Turtle Exercise Comment SNAG, with towel, cervical rotation, 10 reps, 5 wec   Upper Trapezius Stretch, Sitting Comment 10 sec, 5 reps   Total time 10 min   Lumbar/Abdominal Exercises   Lumbar/Abdominal Exercises Yes   Bridges w/ Pillow Squeeze Comment 15 reps, 5 sec   Total time 3 min   Hip Exercises   Hip Exercises Yes   Hip Abduction Clam, Sidelying Comment level 3, 10 reps, 5 sec   Hip Abduction, Standing Comment level 3, 10 reps ,5 sec   Hip Extension, Standing Comment level 3, 10 reps ,5 sec, B   additional exercise SLR, standing, level 3, 10 reps, 5 sec B   additional exercise mini squat, 20 reps   additional exercise SLR, supine, 10 reps 5 sec, B   Total time 20 min   Manual Therapy   Manual Therapy Yes   Suboccipital Release Comments yes   Myofacial Release Comments upper trap, scalene, levator, rhomboid   Total time 20 min   Craig Ionescu Eulis Foster, PTA

## 2020-04-13 NOTE — Progress Notes (Signed)
Physical Therapy Daily Flowsheet:  *Please see Physical Therapy Exercise Flowsheet for details regarding exercises completed this session.*     04/13/20 1000   Overview   Diagnosis Neck pain and Hip pain    Insurance MVP   Script Date 02/05/20   Visit # 15   Pain Assessment   Pain X    0-10 Scale 4   Pain Location Neck   Modalities   Modalities Yes   Cold Pack Yes   Time 10 min   Moist Heat Yes   Time 10 min   Patient Education   Patient Education Yes   Educated in disease process Yes   Educated in home exercise program Yes   Time Calculation   PT Timed Codes 63 min   PT Untimed Codes 20 min   PT Unbilled Time 0   PT Total Treatment [address] [place], PTA

## 2020-04-18 NOTE — Progress Notes (Signed)
Dear Dr. Tish Men, [place], MBBS:    Today I had the pleasure of reevaluating your patient, Lynn Wagner in follow-up consultation.  Patient comes in today reporting significant improvement in the patient's axial neck pain and has been managed with conservative physical rehabilitation measures.  Patient denies any significant upper extremity radiation.    Review of systems: The patient denies any bowel or bladder dysfunction, constitutional symptoms, or any progressive upper extremity weakness.      Impression:    #1.  Significantly improved axial neck cervicalgia likely due to regional myofascial pain syndrome etiology.    Therapeutic plan:    #1.  Hinda Lenis and I discussed the importance of following up with conservative measures including physical rehabilitation measures and home exercise program as previously prescribed and instructed.  The patient will follow-up in my office as needed for any significant re-exacerbations of symptomatology    Thank you for allowing me to participate in the care of Lynn Wagner.  Please feel free to contact me with any questions he may have.    Sincerely    Duff Pozzi K. Carolanne Grumbling.D.

## 2020-04-20 ENCOUNTER — Ambulatory Visit: Payer: No Typology Code available for payment source

## 2020-04-28 ENCOUNTER — Ambulatory Visit: Payer: No Typology Code available for payment source | Attending: Physical Medicine and Rehabilitation

## 2020-04-28 DIAGNOSIS — M25552 Pain in left hip: Secondary | ICD-10-CM | POA: Insufficient documentation

## 2020-04-28 DIAGNOSIS — S161XXD Strain of muscle, fascia and tendon at neck level, subsequent encounter: Secondary | ICD-10-CM | POA: Insufficient documentation

## 2020-04-28 DIAGNOSIS — G8929 Other chronic pain: Secondary | ICD-10-CM | POA: Insufficient documentation

## 2020-04-28 NOTE — Progress Notes (Signed)
Physical Therapy Daily Flowsheet:  *Please see Physical Therapy Exercise Flowsheet for details regarding exercises completed this session.*     04/28/20 1400   Overview   Diagnosis Neck pain and Hip pain    Insurance MVP   Script Date 02/05/20   Visit # 16   Pain Assessment   Pain X    0-10 Scale 1;2   Pain Location Neck   ROM   Flexion   (36 degrees)   Extension   (52 degrees)   Right Lateral Flexion   (37 degrees)   Left Lateral Flexion   (28 degrees)   Right Rotation   (72 degrees)   Left Rotation   (82 degrees)   STRENGTH   Strength Yes   R Shoulder Flexion  5/5   R Shoulder Internal Rotation  5/5   R Shoulder External Rotation (STR) 5/5   L Shoulder Flexion  4+/5;5/5   L Shoulder Internal Rotation  5/5   L Shoulder External Rotation 4+/5;5/5   R Hip Flexion  5/5   R Knee Flexion  5/5   R Knee Extension  5/5   L Hip Flexion  4+/5;5/5   L Knee Flexion  4+/5;5/5   L Knee Extension  5/5   Modalities   Moist Heat Yes   Time 10 min   Functional Outcome Measures   Self Reported Functional Measures Yes   Lower Limb Functional Scale Yes   Any of your usual work, housework or school activities 2   Your usual hobbies, recreational or sporting activities 2   Getting into or out of the bath 4   Walking between rooms 3   Putting on your shoes 1   Squatting 4   Lifting an object like a bag of groceries from the floor 4   Performing light activites around your home 3   Performing heavy activites around your home 4   Getting in or out of the car 4   Walking 2 blocks 3   Walking a mile 4   Going up or down 10 stairs (about 1 flight) 4   Standing for 1 hour 4   Sitting for 1 hour 4   Running on even ground 0   Running on uneven ground 0   Making sharp turns while running fast 0   Hopping 0   Rolling over in bed 3   Lower Limb Functional Score 53   Pain Intensity 1   Personal Care 0   Lifting 1   Reading 1   Headache 0   Concentration 0   Work 2   Driving 1   Sleeping 1   Recreation 2   Neck Disability Index Score 18   Patient  Education   Patient Education Yes   Educated in disease process Yes   Educated in home exercise program Yes   Time Calculation   PT Timed Codes 50 min   PT Untimed Codes 10 min   PT Unbilled Time 0   PT Total Treatment 60   Hadyn Blanck [place], PTA

## 2020-04-28 NOTE — Progress Notes (Signed)
Physical Therapy Exercise Flowsheet:  *Please refer to Physical Therapy Daily Flowsheet for further details of this session.*     04/28/20 1400   Cervical Exercises   Cervical Exercises Yes   Active Cervical Sideflexion Comment 10 sec, 5 reps   Cervical Retraction, Sitting Comment 10 reps, 5 sec   Turtle Exercise Comment SNAG, with towel, cervical rotation, 10 reps, 5 wec   Upper Trapezius Stretch, Sitting Comment 10 sec, 5 reps   Total time 10 min   Shoulder Exercises   Shoulder Exercises Yes   Shoulder External Rotation, Theraband Comment level 3, 10 reps, 5 sec   Shoulder Flexion, Theraband Comment level 3, 10 reps, 5 sec   Shoulder Extension, Theraband Comment level 3,10 reps, 5 sec   Shoulder Internal Rotation, Theraband Comment level 3, 10 reps, 5 sec   Scapular Retraction, Standing Comment level 3, 10 reps, 5 sec   Total time 10 min   Hip Exercises   Hip Exercises Yes   Hip Abduction Clam, Sidelying Comment level 3, 10 reps, 5 sec   Hip Abduction, Standing Comment level 3, 10 reps ,5 sec   Hip Extension, Standing Comment level 3, 10 reps, 5 sec   Total time 10 min   Manual Therapy   Manual Therapy Yes   Suboccipital Release Comments yes   Myofacial Release Comments upper trap, scalene, levator, rhomboid   Total time 20 min   Sathvik Tiedt Eulis Foster, PTA

## 2020-04-29 NOTE — Progress Notes (Signed)
Department of Physical Medicine & Rehabilitation  Physical Therapy Progress Note    HISTORY:  Diagnosis:    Diagnosis: Neck pain and Hip pain   Date of Surgery/Onset Date of Symptoms:        01/07/2020  Referring Provider:        Metro Kung, MD  Frequency:    Once a week  Attendance:    Consistent  Patient's compliance with therapy and home exercise program:      good   Treatment:    AROM/PROM/Therapeutic exercise, Cold pack, Flexibility, Heat, Home exercise program instructions/Patient education, Myofascial release, Patient/Family Education, Postural training/body Curator education, Strengthening    SUBJECTIVE:  Pain:    Improved     0-10 Scale: 1, 2   Pain Location: Neck     OBJECTIVE:  ROM:    ROM   Flexion:  (36 degrees)   Extension:  (52 degrees)   Right Lateral Flexion:  (37 degrees)   Left Lateral Flexion:  (28 degrees)   Right Rotation:  (72 degrees)   Left Rotation:  (82 degrees)  Strength:    STRENGTH   Strength: Yes   R Shoulder Flexion : 5/5   R Shoulder Internal Rotation : 5/5   R Shoulder External Rotation (STR): 5/5   L Shoulder Flexion : 4+/5, 5/5   L Shoulder Internal Rotation : 5/5   L Shoulder External Rotation: 4+/5, 5/5   R Hip Flexion : 5/5   R Knee Flexion : 5/5   R Knee Extension : 5/5   L Hip Flexion : 4+/5, 5/5   L Knee Flexion : 4+/5, 5/5   L Knee Extension : 5/5  Function:   Improving with: turning head with driving, reaching up into cupboards,     Functional Outcome Measures:    Self-Reported:     Disability of arm, shoulder and hand (DASH):  ;  ;     Lower Limb Functional Scale: Lower Limb Functional Score: 53   Neck Disability Index (NDI): Neck Disability Index Score: 18   Oswestry(Low Back Pain and Disability Index):          Patient Education   Patient Education: Yes   Educated in disease process: Yes   Educated in home exercise program: Yes       Progress Towards Previous Goals: good                   Rehab potential/prognosis:good  Patient's  understanding:good  Patient's goal:To be able to swing a golf club      Short term goals(4weeks):  1. Initiate HEP - Achieved  2. Independent with home thermal agents - Achieved  3.NDI</= 20% -partially achieved/progressing  4.Patient will restore cervical ROM by 25% in all planes to improve tolerance to activities.-partially achieved/ T    Long term goals (8weeks): on going  1. Independent with final HEP - on going  2.NDI</= 15% - on going  3.Patient will be able to perform overhead activities for 10 minutes without reports of neck pain.- Achieved  4.Patient will restore cervical ROM to [facility] in all planes to improve tolerance to activities.- partially achieved/progressing  5.Patient will achieve >/= 12repetitions on 30 second chair stand test - Achieved  6.Patient will improve LEFS score by at least 9 points indicating minimal detectable change - Achieved      Thank you for the referral. If you have any questions and/or concerns, please feel free to contact me at [phone].

## 2020-05-02 NOTE — Progress Notes (Addendum)
Department of Physical Medicine & Rehabilitation  Physical Therapy Progress Note    HISTORY:  Diagnosis:    Diagnosis: Neck pain and Hip pain   Date of Surgery/Onset Date of Symptoms:        01/07/2020  Referring Provider:        Metro Kung, MD  Frequency:    Once a week  Attendance:    Consistent  Patient's compliance with therapy and home exercise program:      good   Treatment:    AROM/PROM/Therapeutic exercise, Cold pack, Flexibility, Heat, Home exercise program instructions/Patient education, Myofascial release, Patient/Family Education, Postural training/body Curator education, Strengthening    SUBJECTIVE:  Pain:    Improved     0-10 Scale: 1, 2   Pain Location: Neck     OBJECTIVE:  ROM:    ROM   Flexion:  (36 degrees)   Extension:  (52 degrees)   Right Lateral Flexion:  (37 degrees)   Left Lateral Flexion:  (28 degrees)   Right Rotation:  (72 degrees)   Left Rotation:  (82 degrees)  Strength:    STRENGTH   Strength: Yes   R Shoulder Flexion : 5/5   R Shoulder Internal Rotation : 5/5   R Shoulder External Rotation (STR): 5/5   L Shoulder Flexion : 4+/5, 5/5   L Shoulder Internal Rotation : 5/5   L Shoulder External Rotation: 4+/5, 5/5   R Hip Flexion : 5/5   R Knee Flexion : 5/5   R Knee Extension : 5/5   L Hip Flexion : 4+/5, 5/5   L Knee Flexion : 4+/5, 5/5   L Knee Extension : 5/5  Function:   Improving with: turning head with driving, reaching up into cupboards, sit to stand, decreased pain with ambulation    Still difficulty with the following: Pain with walking down hills, continued tightness in hip with prolonged standing and squatting, pain with looking up activities    Functional Outcome Measures:    Self-Reported:     Disability of arm, shoulder and hand (DASH):  ;  ;     Lower Limb Functional Scale: Lower Limb Functional Score: 53   Neck Disability Index (NDI): Neck Disability Index Score: 18   Oswestry(Low Back Pain and Disability Index):          Patient  Education   Patient Education: Yes   Educated in disease process: Yes   Educated in home exercise program: Yes       Progress Towards Previous Goals: good             Rehab potential/prognosis:good  Patient's understanding:good  Patient's goal:To be able to swing a golf club    Assessment: Lynn Wagner is a 64 y.o. female presents to physical therapy with a chief complaint of neck and hip pain. Patient presents with improving pain, strength and ROM with functions at home. Patient reports able to turn head side to side with driving and reach up with less difficulty. Patient with improving pain with ambulation in left hip but continues to c/o tightness with prolonged standing. Patient to continue to progress with independent home exercise program. Patient is discharged from PT.    Short term goals(4weeks):  1. Initiate HEP - Achieved  2. Independent with home thermal agents - Achieved  3.NDI</= 20% -Achieved  4.Patient will restore cervical ROM by 25% in all planes to improve tolerance to activities.-partially achieved/progressing    Long term goals (8weeks): on going  1. Independent with final HEP - on going  2.NDI</= 15% - on going  3.Patient will be able to perform overhead activities for 10 minutes without reports of neck pain.- Achieved  4.Patient will restore cervical ROM to [facility] in all planes to improve tolerance to activities.- partially achieved/progressing  5.Patient will achieve >/= 12repetitions on 30 second chair stand test - Achieved  6.Patient will improve LEFS score by at least 9 points indicating minimal detectable change - Achieved      Thank you for the referral. If you have any questions and/or concerns, please feel free to contact me at [phone].      Plan: Patient is discharged from PT.    Sherrielyn Eulis Foster, PTA  Jahaziel Francois PT, DPT.

## 2020-07-12 ENCOUNTER — Other Ambulatory Visit
Admission: RE | Admit: 2020-07-12 | Discharge: 2020-07-12 | Disposition: A | Discharge status: Home / Self Care | Payer: No Typology Code available for payment source | Source: Ambulatory Visit | Attending: Internal Medicine | Admitting: Internal Medicine

## 2020-07-12 DIAGNOSIS — E663 Overweight: Secondary | ICD-10-CM | POA: Insufficient documentation

## 2020-07-12 LAB — CBC AND DIFFERENTIAL
Baso # K/uL: 0 10*3/uL (ref 0.0–0.1)
Basophil %: 0.8 %
Eos # K/uL: 0.2 10*3/uL (ref 0.0–0.4)
Eosinophil %: 4.4 %
Hematocrit: 40 % (ref 34–45)
Hemoglobin: 13.1 g/dL (ref 11.2–15.7)
IMM Granulocytes #: 0 10*3/uL (ref 0.0–0.0)
IMM Granulocytes: 0.6 %
Lymph # K/uL: 1.8 10*3/uL (ref 1.2–3.7)
Lymphocyte %: 35.8 %
MCH: 30 pg (ref 26–32)
MCHC: 32 g/dL (ref 32–36)
MCV: 92 fL (ref 79–95)
Mono # K/uL: 0.5 10*3/uL (ref 0.2–0.9)
Monocyte %: 10.7 %
Neut # K/uL: 2.4 10*3/uL (ref 1.6–6.1)
Nucl RBC # K/uL: 0 10*3/uL (ref 0.0–0.0)
Nucl RBC %: 0 /100 WBC (ref 0.0–0.2)
Platelets: 232 10*3/uL (ref 160–370)
RBC: 4.4 MIL/uL (ref 3.9–5.2)
RDW: 13.5 % (ref 11.7–14.4)
Seg Neut %: 47.7 %
WBC: 5.1 10*3/uL (ref 4.0–10.0)

## 2020-07-12 LAB — COMPREHENSIVE METABOLIC PANEL
ALT: 16 U/L (ref 0–35)
AST: 20 U/L (ref 0–35)
Albumin: 4.2 g/dL (ref 3.5–5.2)
Alk Phos: 89 U/L (ref 35–105)
Anion Gap: 10 (ref 7–16)
Bilirubin,Total: 0.4 mg/dL (ref 0.0–1.2)
CO2: 26 mmol/L (ref 20–28)
Calcium: 9.1 mg/dL (ref 8.6–10.2)
Chloride: 107 mmol/L (ref 96–108)
Creatinine: 0.84 mg/dL (ref 0.51–0.95)
GFR,Black: 85 *
GFR,Caucasian: 74 *
Glucose: 92 mg/dL (ref 60–99)
Lab: 16 mg/dL (ref 6–20)
Potassium: 4.8 mmol/L (ref 3.3–5.1)
Sodium: 143 mmol/L (ref 133–145)
Total Protein: 6.2 g/dL — ABNORMAL LOW (ref 6.3–7.7)

## 2020-07-12 LAB — LIPID PANEL
Chol/HDL Ratio: 2.7
Cholesterol: 196 mg/dL
HDL: 72 mg/dL — ABNORMAL HIGH (ref 40–60)
LDL Calculated: 111 mg/dL
Non HDL Cholesterol: 124 mg/dL
Triglycerides: 64 mg/dL

## 2020-07-12 LAB — HEMOGLOBIN A1C: Hemoglobin A1C: 5.5 %

## 2020-08-02 ENCOUNTER — Ambulatory Visit: Payer: No Typology Code available for payment source | Admitting: Orthopedic Surgery

## 2020-08-02 ENCOUNTER — Encounter: Payer: Self-pay | Admitting: Orthopedic Surgery

## 2020-08-02 ENCOUNTER — Ambulatory Visit
Admission: RE | Admit: 2020-08-02 | Discharge: 2020-08-02 | Disposition: A | Discharge status: Home / Self Care | Payer: No Typology Code available for payment source | Source: Ambulatory Visit | Attending: Family | Admitting: Family

## 2020-08-02 DIAGNOSIS — M25552 Pain in left hip: Secondary | ICD-10-CM

## 2020-08-02 DIAGNOSIS — M1612 Unilateral primary osteoarthritis, left hip: Secondary | ICD-10-CM | POA: Insufficient documentation

## 2020-08-02 NOTE — Progress Notes (Signed)
Chief Complaint: Left hip pain    History of Present Illness:  Lynn Wagner is a 64 y.o. female who presents to my clinic with a chief complaint of left groin pain. The patient indicates her groin pain has been ongoing and progressive for the last 5 months (mid may gardening). The patient characterizes her pain as dull, intermittent and rates his pain as moderate/10. Further, the the patient's groin pain is exacerbated by activities of daily living > 4 hours.     Listing & description of previous non surgical treatment:  Activity modification: Yes  Physical Therapy: Yes, therapy refused secondary to exacerbation of symptoms  Trial of medications: Yes, Alieve  Weight loss: No  Assisted ambulatory device: No  Intra-articular injection: No,  Bracing, orthotic devises: No, not indicated for this patient    Contraindications to above treatment: none    Past medical history, past surgical history, family history, social histories, medications, and allergies reviewed and updated as necessary    Past Medical History:  No past medical history on file.    Past Surgical History:  B partial medial UKA    Family History:  No family history on file.    Social History: The patient denies smoking. Skiing     Medications:   Current Outpatient Medications:     Psyllium (METAMUCIL PO), Take by mouth daily, Disp: , Rfl:     naproxen sodium (ANAPROX) 220 MG tablet, Take 220 mg by mouth 2 times daily (with meals), Disp: , Rfl:     multi-vitamin (MULTIVITAMIN) per tablet, Take 1 tablet by mouth daily, Disp: , Rfl:     No Known Allergies (drug, envir, food or latex)      Review of Systems: A 12 category review of systems was reviewed with the patient, she  denies chest pain, shortness of breath, fever/chills, recent weight gain or weight loss, extremity paresthesias, radicular pain, easy bruisability, all systems remaining systems are negative.    Physical Exam:  Vitals were reviewed after taken by a technician:   Blood pressure (P)  126/80, height (P) 1.613 m (5' 3.5"), weight (P) 78.8 kg (173 lb 12.8 oz).  Body mass index is 30.3 kg/m (pended).  Constitutional: No acute distress, appears stated age.  Psychiatric: Normal affect.  Respiratory: Non-labored breathing.  Left hip: Ambulating with a slight antalgic gait, soft tissue envelope intact, no trochanteric tenderness, ROM 120 degrees hip flexion, 10 degrees IR, 20 degrees ER, No crepitus, negative straight leg raise.      Radiographs/Imaging: AP pelvis groin lateral of left hip were independently interpreted by myself. These radiographs demonstrate severe narrowing of the femoral acetabular joint, no acetabular dysplasia, and normal femoral head-neck offset.     Impression:  1.  Left hip OA -  Severe      Plan: I had an extensive discussion with the patient in the office today. We discussed the patient's radiographs, and the natural history of her condition. Treatment options limited to ongoing conservative care and hip replacement, does not wish to consider surgery at this time, referred for PT follow up as needed.     Rationale for deviation from stepped-care approach: none    Answers for HPI/ROS submitted by the patient on 08/01/2020  What is your goal for today's visit?: Find out what is wrong  Date of onset: : 03/22/2020  Was this the result of an injury?: No  What is your pain level?: 7/10  Please describe the quality of your pain: :  aching, discomfort, instability, radiating, shooting, stabbing  What diagnostic workup have you had for this condition?: X-ray  What treatments have you tried for this condition?: acetaminophen, chiropractic manipulation, ice, NSAIDs, physical therapy  Progression since onset: : rapidly worsening  Is this a work related condition? : No  Fever: No  Chills: No  Weight loss: No  Malaise/fatigue: No  Rash: No  Dry skin: Yes  Itching: Yes  Wound : No  Headache: No  Hearing loss: No  Sore throat: No  Dental problem: No  Blindness: No  Visual disturbance:  No  Chest pain: No  Palpitations: No  Leg cramps with exercise: Yes  Leg swelling: Yes  Fainting: No  Heartburn: No  Nausea: No  Vomiting: No  Diarrhea: No  Constipation: Yes  Blood in stool: No  Cough : No  Apnea: No  Shortness of breath: No  Frequency: No  Urgency: Yes  Blood in urine: No  Incomplete emptying: No  Falls: No  Neck pain: Yes  Back pain: Yes  Joint swelling: Yes  Muscle spasms: No  Muscle cramps: Yes  Easy to bruise/bleed: No  Frequent infections: No  Numbness: No  Tingling: No  Seizures: No  Loss of consciousness: No  Weakness: Yes  Depression: No  Nervous/anxious: No  Memory loss: Yes

## 2020-08-09 ENCOUNTER — Encounter: Payer: Self-pay | Admitting: Orthopedic Surgery

## 2020-08-09 ENCOUNTER — Ambulatory Visit: Payer: No Typology Code available for payment source | Admitting: Orthopedic Surgery

## 2020-08-09 VITALS — BP 128/72 | Ht 63.5 in | Wt 173.0 lb

## 2020-08-09 DIAGNOSIS — M1612 Unilateral primary osteoarthritis, left hip: Secondary | ICD-10-CM

## 2020-08-09 NOTE — Progress Notes (Signed)
Subjective: NAIJA TROOST returns with worsening of left hip pain. Last evaluated ~ 1 week ago. Pain advanced limiting quality of life, after further reflection patient would like to pursue THA. She has failed prior conservative care including oral medication, activity modification, home exercise/PT.    ROS: No chest pain, SOB, fever, chills, skin erythema, all remaining systems negative.    Objective:  Constitutional: NAD, comfortable  Psychiatric: Normal affect  Musculoskeletal:  Left hip: Ambulating with a antalgic gait, soft tissue envelope intact, no trochanteric tenderness, ROM 120 degrees hip flexion, 10 degrees IR, 20 degrees ER, No crepitus, negative straight leg raise.    Radiographs: None, prior severe left hip arthrosis with bone on bone contact.    Impression:  1.  Left hip OA - severe    Plan:  Treatment options limited to ongoing conservative care and hip reconstruction. We also discussed hip reconstruction including the rationale for surgery, the technical aspects of the surgery, the anticipated postoperative course, as well as the risks related to total hip arthroplasty.  These risks include but are not limited to infection requiring IV antibiotics, and further surgery. We discussed nerve injury resulting in permanent leg weakness/foot drop, orthosis requirement, and gait impairment. Blood vessel injury requiring further surgery for limb salvage, bleeding requiring transfusion, iatrogenic femur and/or acetbular fracture, hip instability, limb lengthening, wound drainage, incomplete relief of symptoms, component failure, reflex sympathetic dystrophy, risks of anesthesia, risk of blood clots in the legs and/or lungs, post-op ileus, risk of exacerbation of underlying medical issues including heart attack, stroke, respiratory failure, kidney failure, and even death.     Implant selection discussed, questions entertained and answered. Discussed anterior and posterior THA approach.     Left THA  Plan:  Medical Optimization: Y  Clearances: PCP   ICD 10: M16.12  CPT: 27130  Anesthesia: Regional, Per Protocol  Time: 2 hours  Abx: Ancef pending MRSA screen  Approach: Posterior (unless patient changes mind)  Equipment: ZB G7, taperloc, Echo  DVT: ASA  Special Considerations: None  [facility] Prioritization Score: 9

## 2020-08-10 NOTE — Progress Notes (Signed)
Nurse Navigator Review Required No   MD Ginnetti   Procedure: 27130 - Total Hip Arthroplasty   Approach Posterior   Laterality Left      Anesthesia Spinal   Equipment ZB G7, taperloc, Echo     Sent booking information to schedulers. Also, sent message to pt to complete [employer] health history questionnaire and get clearance from their PCP and any additional clearances that were requested per surgeon.

## 2020-09-07 ENCOUNTER — Encounter: Payer: Self-pay | Admitting: Orthopedic Surgery

## 2020-09-26 ENCOUNTER — Ambulatory Visit: Payer: No Typology Code available for payment source | Admitting: Physical Medicine and Rehabilitation

## 2020-11-25 ENCOUNTER — Encounter: Payer: Self-pay | Admitting: Orthopedic Surgery

## 2020-12-06 ENCOUNTER — Encounter: Payer: Self-pay | Admitting: Orthopedic Surgery

## 2020-12-06 ENCOUNTER — Other Ambulatory Visit: Payer: Self-pay | Admitting: Orthopedic Surgery

## 2020-12-06 DIAGNOSIS — Z01818 Encounter for other preprocedural examination: Secondary | ICD-10-CM

## 2020-12-22 ENCOUNTER — Ambulatory Visit
Admission: RE | Admit: 2020-12-22 | Discharge: 2020-12-22 | Disposition: A | Discharge status: Home / Self Care | Payer: No Typology Code available for payment source | Source: Ambulatory Visit

## 2020-12-22 ENCOUNTER — Ambulatory Visit
Admission: RE | Admit: 2020-12-22 | Discharge: 2020-12-22 | Disposition: A | Discharge status: Home / Self Care | Payer: No Typology Code available for payment source | Source: Ambulatory Visit | Attending: Anesthesiology | Admitting: Anesthesiology

## 2020-12-22 DIAGNOSIS — M1612 Unilateral primary osteoarthritis, left hip: Secondary | ICD-10-CM | POA: Insufficient documentation

## 2020-12-22 DIAGNOSIS — Z01818 Encounter for other preprocedural examination: Secondary | ICD-10-CM | POA: Insufficient documentation

## 2020-12-22 HISTORY — DX: Unilateral primary osteoarthritis, left hip: M16.12

## 2020-12-22 LAB — HEMOGLOBIN A1C: Hemoglobin A1C: 5.5 %

## 2020-12-22 LAB — COMPREHENSIVE METABOLIC PANEL
ALT: 17 U/L (ref 0–35)
AST: 21 U/L (ref 0–35)
Albumin: 4.5 g/dL (ref 3.5–5.2)
Alk Phos: 91 U/L (ref 35–105)
Anion Gap: 10 (ref 7–16)
Bilirubin,Total: 0.4 mg/dL (ref 0.0–1.2)
CO2: 26 mmol/L (ref 20–28)
Calcium: 9.8 mg/dL (ref 8.6–10.2)
Chloride: 104 mmol/L (ref 96–108)
Creatinine: 0.83 mg/dL (ref 0.51–0.95)
Glucose: 98 mg/dL (ref 60–99)
Lab: 13 mg/dL (ref 6–20)
Potassium: 4.7 mmol/L (ref 3.3–5.1)
Sodium: 140 mmol/L (ref 133–145)
Total Protein: 6.7 g/dL (ref 6.3–7.7)
eGFR BY CREAT: 78 *

## 2020-12-22 LAB — APTT: aPTT: 32.1 s (ref 25.8–37.9)

## 2020-12-22 LAB — PROTIME-INR
INR: 1 (ref 0.9–1.1)
Protime: 11.8 s (ref 10.0–12.9)

## 2020-12-22 LAB — CBC
Hematocrit: 41 % (ref 34–45)
Hemoglobin: 13.8 g/dL (ref 11.2–15.7)
MCH: 30 pg (ref 26–32)
MCHC: 34 g/dL (ref 32–36)
MCV: 90 fL (ref 79–95)
Platelets: 250 10*3/uL (ref 160–370)
RBC: 4.6 MIL/uL (ref 3.9–5.2)
RDW: 13.5 % (ref 11.7–14.4)
WBC: 4.3 10*3/uL (ref 4.0–10.0)

## 2020-12-22 LAB — TYPE AND SCREEN
ABO RH Blood Type: A NEG
Antibody Screen: NEGATIVE

## 2020-12-22 LAB — ORTHOPEDICS SA NASAL COMPLETE PCR: Orthopedics SA nasal complete PCR: 0

## 2020-12-22 LAB — ORTHOPEDICS MRSA NASAL COMPLETE PCR: Orthopedics MRSA nasal complete PCR: 0

## 2020-12-22 NOTE — Discharge Instructions (Signed)
Pre-Operative Instructions - Total Joint Replacement                FOLLOW YOUR SURGEON'S INSTRUCTIONS IF DIFFERENT THAN BELOW.                   PRIOR TO SURGERY  · Five days before surgery, please STOP taking if surgeon does not specify:  1.  Anti-inflammatory medications (Ibuprofen, Motrin, Advil, Mobic, Meloxicam, Aleve, Naproxen, Voltaren,       etc.)  2.  Vitamins and herbal supplements, including herbal teas     · YOU MAY TAKE ACETAMINOPHEN (TYLENOL) as needed    · Directions regarding your prescribed blood thinners, including aspirin, must be approved by your cardiologist or prescribing doctor.      THREE NIGHTS BEFORE SURGERY FOR INFECTION CONTROL  • We ask you to shower for 3 evenings before your surgery using the 4% Chlorhexidine soap the nurse has given you.     • Each night take a shower using your normal soap and shampoo.    • Afterward, while still in the shower, pour approximately1/3 of the 4% Chlorhexidine soap on a washcloth and wash your body from the neck down. DO NOT wash your head, face, eyes and ears with the Chlorhexidine soap. This soap does not lather. Let the soap sit on your skin for 2 minutes.  Rinse thoroughly.     • Dry off with a clean, fresh towel each evening.    • Do not apply lotion after your showers. Do not shave below your waist for one week before your surgery.    • If you experience itching or redness on application, wash it off and do not use it again. Continue with an over-the-counter antibacterial soap (such as Dial).    • After showering, dress in freshly laundered clothes each evening and sleep on clean bedsheets.    ARRIVAL/SURGICAL TIME  · Staff from the [facility] will call you between 130PM and 4PM on the day before surgery to inform you of your arrival and surgery times. The call will be on Friday if your surgery is on Monday.    DAY BEFORE  SURGERY  · Keep yourself well hydrated to aid in the placement of your IV and for your general well-being.  · Do not eat anything after midnight the night before your surgery (including candy or gum).                                                                                         DAY OF SURGERY  · DO NOT CONSUME FOOD OF ANY KIND.    · Only Gatorade, clear apple juice or water from midnight until 2 hours before your scheduled surgery.    · DO NOT WEAR: HEAVY MAKEUP, DARK NAIL POLISH, HAIR PINS, BODY LOTION OR SCENTS.      · Please understand that rings and body piercings need to be removed and left at home. If they are not removed, your surgery is at risk of being delayed or cancelled.     · When you come to , please try not to bring   any valuable items with the exception of your phone to keep you in contact with your family members.     ·  Valuables such as jewelry, cash and credit cards are best left at home during your stay.  Please send them home with family members.  If you are alone a staff member will assist you in storing your belongings.    · We ask that your cell phone be with you and turned on, so that the Preop and Operating Room nurses may phone you directly with instructions on the morning of surgery if necessary.    · [facility] does not assume responsibility for items brought with you on the day of surgery.    · If wearing eyeglasses, please bring a case.  DO NOT WEAR CONTACT LENSES.    · You may brush your teeth, shower (with Chlorhexidine soap) and use deodorant.    · Patient visitation is restricted at this time due to COVID-19 concerns.     SURGICAL SITE INFECTION MEASURES PERFORMED ON THE DAY OF SURGERY     Neck to toes Chlorhexidine bath-6 prepackaged wipes applied to the skin surface   Nasal Antiseptic-4 swabs used to clean nostrils    Chlorhexidine mouth rinse for 30 seconds      MEDICATIONS: DAY OF SURGERY  · Take your medications as directed according to your printed,  verbal or MyChart instructions.    · Anxiety and pain medications may be taken as prescribed at any time prior to arrival.   · Medications from the [facility] will be administered to you under the direction of your surgeon. Please leave your prescriptions at home with the exception of your inhalers.      AT THE HOSPITAL ON THE DAY OF SURGERY  · Park in the Main Ramp garage.  Enter the building through the Main Lobby.     · Please stop at the Information Desk and let them know you are here for surgery.    · Leave your belongings in the car. Your visitors may bring them to your room after surgery.    [facility]  As a convenience, prior to your discharge we will fill any discharge medications you require.      The pharmacy is open from 9am to 5:30pm on weekdays and 10am-2pm on Saturdays.     If you will be alone on the day of surgery and want to leave with your prescribed medication, you may:  · Bring a check made out to [facility]  · Use a credit card to pay for your prescription  · Have your family call the pharmacy with a credit card number  · Only bring cash to the hospital as a last resort. Prescriptions cannot be filled without payment. Thank you for your consideration.    QUESTIONS?  · Question about these instructions? Call [phone], select option 2, and leave a message at any time.   A nurse will return your call during our regular business hours.  · Any questions regarding specifics about your surgery or recovery? Please call your surgeon’s office.

## 2020-12-22 NOTE — Anesthesia Preprocedure Evaluation (Addendum)
Anesthesia Pre-operative History and Physical for Lynn Wagner  History and Physical Performed at [facility]  Highlighted Issues for this Procedure:  65 y.o. female with Primary osteoarthritis of left hip (M16.12) presenting for Procedure(s):  LEFT ARTHROPLASTY, HIP, TOTAL by Surgeon(s):  Rod Holler, MD  Laurance Flatten, MD scheduled for  minutes.      .  [facility]/PAT Summary:  Lynn Wagner presents preoperatively for anesthesia evaluation prior to left total hip with Dr. Ardine Eng 01/09/21.  She has a past medical history of Primary osteoarthritis of left hip (12/22/2020).  The patient has no dyspnea, denies chest pain, is moderately active, climbs stairs and can lie flat. She answered no to all perioperative EKG screening questions.  The pt denies personal or family history of anesthesia complications.  By Herma Mering, PA at 9:09 AM on 12/22/2020    Anesthesia Evaluation Information Source: patient, records     ANESTHESIA HISTORY  Pertinent(-):  No History of anesthetic complications or Family hx of anesthetic complications    GENERAL  Comment: No rashes, wounds, open sores at pre op  Pertinent (-):  No infection, history of anesthetic complications or Family Hx of Anesthetic Complications    HEENT    + Visual Impairment          corrective lens for ADL    + Neck Pain (after collarbone fracture)  Pertinent (-):  No glaucoma, hearing loss, hoarseness or sinus issues PULMONARY    + Smoker          tobacco former    + Snoring  Pertinent(-):  No asthma, shortness of breath, cough/congestion, sleep apnea or COPD    CARDIOVASCULAR  Good(4+METs) Exercise Tolerance  Pertinent(-):  No hypertension, past MI, cardiac testing, CAD, coronary intervention, anticoagulants/antiplatelet medications, dysrhythmias, CHF, vascular Issues or hx of DVT    GI/HEPATIC/RENAL   NPO: > 8hrs ago (solids) Enter Last PO Intake in ROS/Med Hx Tab      + Renal Issues          hx of kidney stones  Pertinent(-):  No GERD, PUD, alcohol use, liver   issues, bowel issues or urinary issues  NEURO/PSYCH    + Chronic pain (hip. neck)  Pertinent(-):  No headaches, neuropsychiatric issues, seizures, cerebrovascular event, gait/mobility issues or positioning issues    ENDO/OTHER  Pertinent(-):  No diabetes mellitus, thyroid disease, hormone use, steroid use, chemo Hx, menstruating (hysterectomy)    HEMATOLOGIC    + Arthritis          hips, neck and lumbar  Pertinent(-):  No bruising/bleeding easily, coagulopathy, anticoagulants/antiplatelet medications or blood dyscrasia         Physical Exam    Airway            Mouth opening: normal            Mallampati: II            TM distance (fb): >3 FB            Neck ROM: limited            Comment: Small mouth.   Prominent capped upper and limited neck extension.            Airway Impression: challenging  Dental   Normal Exam       Comment: Missing multiple posterior teeth.  Intact posterior crowns.   Cardiovascular  Normal Exam           Rhythm: regular  Rate: normal  No friction rub, peripheral edema or murmur      Neurologic    Normal Exam   Comment: Increased lumbar lordosis with difficult to palpate interspaces.    General Survey    Normal Exam   Pulmonary   Normal Exam    breath sounds clear to auscultation    No cough, rhonchi, decreased breath sounds, wheezes, rales    Mental Status   Normal Exam    oriented to person, place and time    Operative Site    Normal Exam  No signs of infection, broken skin integrity, ecchymosis, swelling or erythema   Comment: Left hip     ________________________________________________________________________  PLAN  ASA Score  2  Anesthetic Plan general      Protocols/Care Pathways (Total Joint Arthroplasty) Induction (routine IV) General Anesthesia/Sedation Maintenance Plan (inhaled agents);  Airway Manipulation (none); Airway (LMA); Line ( use current access); Monitoring (standard ASA); Positioning (supine); PONV Plan (dexamethasone and ondansetron); Pain (per surgical team  and intraop local- Spinal for acute pain control and muscle relaxation); PostOp (PACU)    Informed Consent     Risks:         Risks discussed were commensurate with the plan listed above with the following specific points: N/V, aspiration, sore throat, failed block and infection, Damage to: eyes, teeth, nerves and blood vessels, allergic Rx, unexpected serious injury and death.    Anesthetic Consent:         Anesthetic plan (and risks as noted above) were discussed with patient and spouse    Blood products Consent:        Use of blood products discussed with: patient and spouse and they consented    Plan also discussed with team members including:       surgeon    Responsible Anesthesia Provider Attestation:  I attest that the patient or proxy understands and accepts the risks and benefits of the anesthesia plan. I also attest that I have personally performed a pre-anesthetic examination and evaluation, and prescribed the anesthetic plan for this particular location within 48 hours prior to the anesthetic as documented. Stann Mainland, MD  01/09/21, 11:52 AM

## 2021-01-03 NOTE — Comprehensive Assessment (Addendum)
01/03/21 0913   Contact Information   Spokesperson Name Ray   Relationship to Patient  Spouse   Is spokesperson the patient's caregiver after discharge? Yes   Discharge transportation   Relationship to Patient  Spouse      01/03/21 0913   Demographics   County [place]idence [place]   Marital status Married   Ethnicity/Race Caucasian   Primary Language English   Primary Care Taker of? No one   Risk Factors   Risk Factors Adjustment to Dx/Injury/Illness   Living Situation   Lives With Spouse   Can they assist patient after discharge? Yes   Home Geography   Type of Home 2 Story home   # Of Steps In Home 0   # Steps to Enter Home 1   Bedroom First floor   Bathroom First floor - full   Utilitites Working Yes   Palliative Care Assessment   Current Goal of Care Curative   Baseline ADL functioning   Transfers Independent   Ambulation Independent   Assistive Device none   Bathing/Grooming Independent   Meal Prep Independent   Able to feed self? Yes   Household maintenance/chores Independent   Income Information   Vocational Retired   Copywriter, advertising   Current home equipment available Walker;Reacher,cane, higher toilet   Time Spent   Time Spent with Patient (min) 5     Left a vm with contact information to review d/c plan after upcoming surgery. Await return call. The above information is received from [facility], waiting to confirm DME and support person.     Fernand Parkins MSW  Pager: [phone]    Pt called back to confirm the above. SW reviewed home care and options, selected [facility], referral initiated today with Misty Stanley.    Fernand Parkins MSW  Pager: [phone]

## 2021-01-04 ENCOUNTER — Ambulatory Visit: Payer: No Typology Code available for payment source | Attending: Orthopedic Surgery

## 2021-01-04 DIAGNOSIS — Z20822 Contact with and (suspected) exposure to covid-19: Secondary | ICD-10-CM | POA: Insufficient documentation

## 2021-01-04 DIAGNOSIS — Z01812 Encounter for preprocedural laboratory examination: Secondary | ICD-10-CM | POA: Insufficient documentation

## 2021-01-04 DIAGNOSIS — Z01818 Encounter for other preprocedural examination: Secondary | ICD-10-CM | POA: Insufficient documentation

## 2021-01-04 DIAGNOSIS — Z20828 Contact with and (suspected) exposure to other viral communicable diseases: Secondary | ICD-10-CM | POA: Insufficient documentation

## 2021-01-04 LAB — COVID-19 NAAT (PCR): COVID-19 NAAT (PCR): NEGATIVE

## 2021-01-04 LAB — COVID-19 PCR

## 2021-01-09 ENCOUNTER — Encounter
Admission: RE | Disposition: A | Discharge status: Home Health | Payer: Self-pay | Source: Ambulatory Visit | Attending: Orthopedic Surgery

## 2021-01-09 ENCOUNTER — Inpatient Hospital Stay: Payer: No Typology Code available for payment source | Admitting: Anesthesiology

## 2021-01-09 ENCOUNTER — Inpatient Hospital Stay: Payer: No Typology Code available for payment source

## 2021-01-09 ENCOUNTER — Inpatient Hospital Stay: Payer: No Typology Code available for payment source | Admitting: Radiology

## 2021-01-09 ENCOUNTER — Inpatient Hospital Stay: Payer: No Typology Code available for payment source | Admitting: Internal Medicine

## 2021-01-09 ENCOUNTER — Inpatient Hospital Stay
Admission: RE | Admit: 2021-01-09 | Discharge: 2021-01-10 | Disposition: A | Discharge status: Home Health | Payer: No Typology Code available for payment source | Source: Ambulatory Visit | Attending: Orthopedic Surgery | Admitting: Orthopedic Surgery

## 2021-01-09 DIAGNOSIS — M1612 Unilateral primary osteoarthritis, left hip: Secondary | ICD-10-CM

## 2021-01-09 DIAGNOSIS — Z471 Aftercare following joint replacement surgery: Secondary | ICD-10-CM

## 2021-01-09 DIAGNOSIS — Z96649 Presence of unspecified artificial hip joint: Secondary | ICD-10-CM

## 2021-01-09 DIAGNOSIS — Z96642 Presence of left artificial hip joint: Secondary | ICD-10-CM

## 2021-01-09 DIAGNOSIS — Z6832 Body mass index (BMI) 32.0-32.9, adult: Secondary | ICD-10-CM | POA: Insufficient documentation

## 2021-01-09 DIAGNOSIS — R001 Bradycardia, unspecified: Secondary | ICD-10-CM

## 2021-01-09 DIAGNOSIS — M25552 Pain in left hip: Secondary | ICD-10-CM

## 2021-01-09 DIAGNOSIS — Z87891 Personal history of nicotine dependence: Secondary | ICD-10-CM | POA: Insufficient documentation

## 2021-01-09 DIAGNOSIS — E669 Obesity, unspecified: Secondary | ICD-10-CM | POA: Insufficient documentation

## 2021-01-09 HISTORY — PX: PR TOTAL HIP ARTHROPLASTY: 27130

## 2021-01-09 HISTORY — PX: PR ARTHRP ACETBLR/PROX FEM PROSTC AGRFT/ALGRFT: 27130

## 2021-01-09 LAB — BASIC METABOLIC PANEL
Anion Gap: 15 (ref 7–16)
CO2: 22 mmol/L (ref 20–28)
Calcium: 8.6 mg/dL (ref 8.6–10.2)
Chloride: 104 mmol/L (ref 96–108)
Creatinine: 0.74 mg/dL (ref 0.51–0.95)
Glucose: 185 mg/dL — ABNORMAL HIGH (ref 60–99)
Lab: 13 mg/dL (ref 6–20)
Potassium: 4.7 mmol/L (ref 3.3–5.1)
Sodium: 141 mmol/L (ref 133–145)
eGFR BY CREAT: 90 *

## 2021-01-09 LAB — EKG 12-LEAD
P: 52 deg
PR: 167 ms
QRS: 52 deg
QRSD: 114 ms
QT: 485 ms
QTc: 411 ms
Rate: 43 {beats}/min
T: 75 deg

## 2021-01-09 LAB — TYPE AND SCREEN
ABO RH Blood Type: A NEG
Antibody Screen: NEGATIVE

## 2021-01-09 LAB — HCT AND HGB
Hematocrit: 39 % (ref 34–45)
Hemoglobin: 12.8 g/dL (ref 11.2–15.7)

## 2021-01-09 LAB — POCT GLUCOSE: Glucose POCT: 85 mg/dL (ref 60–99)

## 2021-01-09 LAB — MCHC: MCHC: 33 g/dL (ref 32–36)

## 2021-01-09 SURGERY — ARTHROPLASTY, HIP, TOTAL
Anesthesia: General | Site: Hip | Laterality: Left | Wound class: Clean

## 2021-01-09 MED ORDER — OXYCODONE HCL 5 MG PO TABS *I*
5.0000 mg | ORAL_TABLET | ORAL | Status: DC | PRN
Start: 2021-01-09 — End: 2021-01-10
  Administered 2021-01-09 – 2021-01-10 (×5): 5 mg via ORAL
  Filled 2021-01-09 (×5): qty 1

## 2021-01-09 MED ORDER — TRANEXAMIC ACID 1000 MG / 0.7% NACL 100 ML IVPB *I*
1000.0000 mg | Freq: Once | INTRAVENOUS | Status: AC
Start: 2021-01-09 — End: 2021-01-09
  Administered 2021-01-09: 1000 mg via INTRAVENOUS
  Filled 2021-01-09: qty 100

## 2021-01-09 MED ORDER — ASPIRIN 81 MG PO TBEC *I*
81.0000 mg | DELAYED_RELEASE_TABLET | Freq: Two times a day (BID) | ORAL | 0 refills | Status: AC
Start: 2021-01-09 — End: 2021-02-07
  Filled 2021-01-09: qty 56, 28d supply, fill #0

## 2021-01-09 MED ORDER — LIDOCAINE HCL (PF) 1 % IJ SOLN *I*
0.1000 mL | INTRAMUSCULAR | Status: DC | PRN
Start: 2021-01-09 — End: 2021-01-09
  Administered 2021-01-09: 0.1 mL via SUBCUTANEOUS

## 2021-01-09 MED ORDER — SODIUM CHLORIDE 0.9 % IR SOLN *I*
Status: DC | PRN
Start: 2021-01-09 — End: 2021-01-09
  Administered 2021-01-09: 200 mL

## 2021-01-09 MED ORDER — FENTANYL CITRATE 50 MCG/ML IJ SOLN *WRAPPED*
INTRAMUSCULAR | Status: AC
Start: 2021-01-09 — End: 2021-01-09
  Filled 2021-01-09: qty 2

## 2021-01-09 MED ORDER — DEXTROSE 5 % FLUSH FOR PUMPS *I*
0.0000 mL/h | INTRAVENOUS | Status: DC | PRN
Start: 2021-01-09 — End: 2021-01-09

## 2021-01-09 MED ORDER — ONDANSETRON HCL 2 MG/ML IV SOLN *I*
4.0000 mg | Freq: Four times a day (QID) | INTRAMUSCULAR | Status: DC | PRN
Start: 2021-01-09 — End: 2021-01-10
  Administered 2021-01-10: 4 mg via INTRAVENOUS
  Filled 2021-01-09: qty 2

## 2021-01-09 MED ORDER — SODIUM CHLORIDE 0.9 % FLUSH FOR PUMPS *I*
0.0000 mL/h | INTRAVENOUS | Status: DC | PRN
Start: 2021-01-09 — End: 2021-01-10

## 2021-01-09 MED ORDER — DEXAMETHASONE SODIUM PHOSPHATE 4 MG/ML INJ SOLN *WRAPPED*
INTRAMUSCULAR | Status: DC | PRN
Start: 2021-01-09 — End: 2021-01-09
  Administered 2021-01-09: 4 mg via INTRAVENOUS

## 2021-01-09 MED ORDER — CELECOXIB 200 MG PO CAPS *I*
200.0000 mg | ORAL_CAPSULE | Freq: Once | ORAL | Status: AC
Start: 2021-01-09 — End: 2021-01-09
  Administered 2021-01-09: 200 mg via ORAL
  Filled 2021-01-09: qty 1

## 2021-01-09 MED ORDER — SODIUM CHLORIDE 0.9 % 25 ML IV SOLN *I*
6.2500 mg | Freq: Once | INTRAVENOUS | Status: DC | PRN
Start: 2021-01-09 — End: 2021-01-09

## 2021-01-09 MED ORDER — CAMPHOR-MENTHOL 0.5-0.5 % EX LOTN *I*
TOPICAL_LOTION | CUTANEOUS | Status: DC | PRN
Start: 2021-01-09 — End: 2021-01-10

## 2021-01-09 MED ORDER — KETAMINE HCL 10 MG/ML IJ/IV SOLN *WRAPPED*
Status: DC | PRN
Start: 2021-01-09 — End: 2021-01-09
  Administered 2021-01-09: 30 mg via INTRAVENOUS

## 2021-01-09 MED ORDER — ACETAMINOPHEN 500 MG PO TABS *I*
1000.0000 mg | ORAL_TABLET | Freq: Once | ORAL | Status: AC
Start: 2021-01-09 — End: 2021-01-09
  Administered 2021-01-09: 1000 mg via ORAL
  Filled 2021-01-09: qty 2

## 2021-01-09 MED ORDER — PSYLLIUM 95 % PO PACK *I*
1.0000 | PACK | Freq: Every day | ORAL | Status: DC
Start: 2021-01-10 — End: 2021-01-10
  Administered 2021-01-10: 1 via ORAL
  Filled 2021-01-09 (×2): qty 1

## 2021-01-09 MED ORDER — HYDROMORPHONE HCL PF 1 MG/ML IJ SOLN *WRAPPED*
0.5000 mg | INTRAMUSCULAR | Status: DC | PRN
Start: 2021-01-09 — End: 2021-01-09
  Administered 2021-01-09: 0.5 mg via INTRAVENOUS
  Filled 2021-01-09: qty 0.5

## 2021-01-09 MED ORDER — MIDAZOLAM HCL 1 MG/ML IJ SOLN *I* WRAPPED
INTRAMUSCULAR | Status: DC | PRN
Start: 2021-01-09 — End: 2021-01-09
  Administered 2021-01-09 (×2): 2 mg via INTRAVENOUS

## 2021-01-09 MED ORDER — ACETAMINOPHEN 500 MG PO TABS *I*
1000.0000 mg | ORAL_TABLET | Freq: Three times a day (TID) | ORAL | 0 refills | Status: AC
Start: 2021-01-09 — End: ?
  Filled 2021-01-09: qty 100, 16d supply, fill #0

## 2021-01-09 MED ORDER — MEPERIDINE HCL 25 MG/ML IJ SOLN *I*
12.5000 mg | INTRAMUSCULAR | Status: DC | PRN
Start: 2021-01-09 — End: 2021-01-09

## 2021-01-09 MED ORDER — CEFAZOLIN 2000 MG IN STERILE WATER 20ML SYRINGE *I*
2000.0000 mg | PREFILLED_SYRINGE | Freq: Once | INTRAVENOUS | Status: AC
Start: 2021-01-09 — End: 2021-01-09
  Administered 2021-01-09: 2000 mg via INTRAVENOUS
  Filled 2021-01-09: qty 20

## 2021-01-09 MED ORDER — POVIDONE-IODINE 5 % EX SOLN *I*
Freq: Once | CUTANEOUS | Status: AC
Start: 2021-01-09 — End: 2021-01-09

## 2021-01-09 MED ORDER — SODIUM CHLORIDE 0.9 % IV SOLN WRAPPED *I*
20.0000 mL/h | Status: DC
Start: 2021-01-09 — End: 2021-01-09

## 2021-01-09 MED ORDER — DIPHENHYDRAMINE HCL 50 MG/ML IJ SOLN *I*
25.0000 mg | INTRAMUSCULAR | Status: DC | PRN
Start: 2021-01-09 — End: 2021-01-09

## 2021-01-09 MED ORDER — BUPIVACAINE IN DEXTROSE 0.75-8.25 % IT SOLN *I*
INTRATHECAL | Status: DC | PRN
Start: 2021-01-09 — End: 2021-01-09
  Administered 2021-01-09: 1.4 mL via INTRATHECAL

## 2021-01-09 MED ORDER — DOCUSATE SODIUM 100 MG PO CAPS *I*
200.0000 mg | ORAL_CAPSULE | Freq: Every day | ORAL | 0 refills | Status: DC
Start: 2021-01-09 — End: 2021-01-24
  Filled 2021-01-09: qty 100, 50d supply, fill #0

## 2021-01-09 MED ORDER — KETOROLAC TROMETHAMINE 30 MG/ML IJ SOLN *I*
INTRAMUSCULAR | Status: AC
Start: 2021-01-09 — End: 2021-01-09
  Filled 2021-01-09: qty 1

## 2021-01-09 MED ORDER — POLYETHYLENE GLYCOL 3350 PO PACK 17 GM *I*
17.0000 g | PACK | Freq: Every day | ORAL | Status: DC
Start: 2021-01-09 — End: 2021-01-10
  Administered 2021-01-09 – 2021-01-10 (×2): 17 g via ORAL
  Filled 2021-01-09 (×2): qty 17

## 2021-01-09 MED ORDER — ACETAMINOPHEN 500 MG PO TABS *I*
1000.0000 mg | ORAL_TABLET | Freq: Three times a day (TID) | ORAL | Status: DC
Start: 2021-01-09 — End: 2021-01-10
  Administered 2021-01-09 – 2021-01-10 (×4): 1000 mg via ORAL
  Filled 2021-01-09 (×4): qty 2

## 2021-01-09 MED ORDER — KETAMINE HCL 10 MG/ML IJ/IV SOLN *WRAPPED*
Status: AC
Start: 2021-01-09 — End: 2021-01-09
  Filled 2021-01-09: qty 20

## 2021-01-09 MED ORDER — LACTATED RINGERS IV SOLN *I*
125.0000 mL/h | INTRAVENOUS | Status: DC
Start: 2021-01-09 — End: 2021-01-09
  Administered 2021-01-09: 125 mL/h via INTRAVENOUS

## 2021-01-09 MED ORDER — LACTATED RINGERS IV SOLN *I*
20.0000 mL/h | INTRAVENOUS | Status: DC
Start: 2021-01-09 — End: 2021-01-09
  Administered 2021-01-09: 20 mL/h via INTRAVENOUS

## 2021-01-09 MED ORDER — PROPOFOL INFUSION 10 MG/ML *I*
INTRAVENOUS | Status: DC | PRN
Start: 2021-01-09 — End: 2021-01-09
  Administered 2021-01-09: 50 ug/kg/min via INTRAVENOUS
  Administered 2021-01-09: 25 ug/kg/min via INTRAVENOUS

## 2021-01-09 MED ORDER — SODIUM CHLORIDE 0.9 % FLUSH FOR PUMPS *I*
0.0000 mL/h | INTRAVENOUS | Status: DC | PRN
Start: 2021-01-09 — End: 2021-01-09

## 2021-01-09 MED ORDER — LUBIPROSTONE 24 MCG PO CAPS *I*
24.0000 ug | ORAL_CAPSULE | Freq: Two times a day (BID) | ORAL | Status: DC
Start: 2021-01-09 — End: 2021-01-10
  Administered 2021-01-09 – 2021-01-10 (×2): 24 ug via ORAL
  Filled 2021-01-09 (×2): qty 1

## 2021-01-09 MED ORDER — CALCIUM CARBONATE ANTACID 500 MG PO CHEW *I*
1000.0000 mg | CHEWABLE_TABLET | Freq: Three times a day (TID) | ORAL | Status: DC | PRN
Start: 2021-01-09 — End: 2021-01-10
  Administered 2021-01-10: 1000 mg via ORAL
  Filled 2021-01-09: qty 2

## 2021-01-09 MED ORDER — ATROPINE SULFATE 1 MG/ML IJ/IV SOLN *WRAPPED*
INTRAMUSCULAR | Status: DC | PRN
  Administered 2021-01-09: .6 mg via INTRAVENOUS

## 2021-01-09 MED ORDER — CHLORHEXIDINE GLUCONATE 0.12 % MT SOLN *STORES SUPPLIED*
15.0000 mL | Freq: Once | OROMUCOSAL | Status: AC
Start: 2021-01-09 — End: 2021-01-09
  Administered 2021-01-09: 15 mL via OROMUCOSAL

## 2021-01-09 MED ORDER — OXYCODONE HCL 5 MG PO TABS *I*
10.0000 mg | ORAL_TABLET | ORAL | Status: DC | PRN
Start: 2021-01-09 — End: 2021-01-10

## 2021-01-09 MED ORDER — DEXMEDETOMIDINE HCL 200 MCG/2ML IV SOLN WRAPPED *I*
INTRAVENOUS | Status: DC | PRN
Start: 2021-01-09 — End: 2021-01-09
  Administered 2021-01-09: 25 ug via INTRAVENOUS

## 2021-01-09 MED ORDER — SODIUM CHLORIDE 0.9 % IV SOLN WRAPPED *I*
125.0000 mL/h | Status: DC
Start: 2021-01-09 — End: 2021-01-10
  Administered 2021-01-09 – 2021-01-10 (×2): 125 mL/h via INTRAVENOUS

## 2021-01-09 MED ORDER — ONDANSETRON HCL 2 MG/ML IV SOLN *I*
INTRAMUSCULAR | Status: DC | PRN
Start: 2021-01-09 — End: 2021-01-09
  Administered 2021-01-09: 4 mg via INTRAMUSCULAR

## 2021-01-09 MED ORDER — CEFAZOLIN 2000 MG IN STERILE WATER 20ML SYRINGE *I*
2000.0000 mg | PREFILLED_SYRINGE | Freq: Three times a day (TID) | INTRAVENOUS | Status: AC
Start: 2021-01-09 — End: 2021-01-10
  Administered 2021-01-09 – 2021-01-10 (×2): 2000 mg via INTRAVENOUS
  Filled 2021-01-09 (×2): qty 20

## 2021-01-09 MED ORDER — HALOPERIDOL LACTATE 5 MG/ML IJ SOLN *I*
0.5000 mg | Freq: Once | INTRAMUSCULAR | Status: DC | PRN
Start: 2021-01-09 — End: 2021-01-09

## 2021-01-09 MED ORDER — MIDAZOLAM HCL 1 MG/ML IJ SOLN *I* WRAPPED
INTRAMUSCULAR | Status: AC
Start: 2021-01-09 — End: 2021-01-09
  Filled 2021-01-09: qty 5

## 2021-01-09 MED ORDER — MELOXICAM 7.5 MG PO TABS *I*
15.0000 mg | ORAL_TABLET | Freq: Every day | ORAL | Status: DC
Start: 2021-01-10 — End: 2021-01-10
  Administered 2021-01-10: 15 mg via ORAL
  Filled 2021-01-09: qty 2

## 2021-01-09 MED ORDER — MORPHINE SULFATE ER 15 MG PO TBCR *I*
15.0000 mg | ORAL_TABLET | Freq: Two times a day (BID) | ORAL | Status: DC
Start: 2021-01-09 — End: 2021-01-10
  Administered 2021-01-09: 15 mg via ORAL
  Filled 2021-01-09: qty 1

## 2021-01-09 MED ORDER — PROPOFOL 10 MG/ML IV EMUL (INTERMITTENT DOSING) WRAPPED *I*
INTRAVENOUS | Status: DC | PRN
Start: 2021-01-09 — End: 2021-01-09
  Administered 2021-01-09: 50 mg via INTRAVENOUS

## 2021-01-09 MED ORDER — FENTANYL CITRATE 50 MCG/ML IJ SOLN *WRAPPED*
INTRAMUSCULAR | Status: DC | PRN
Start: 2021-01-09 — End: 2021-01-09
  Administered 2021-01-09: 100 ug via INTRAVENOUS

## 2021-01-09 MED ORDER — SENNOSIDES 8.6 MG PO TABS *I*
2.0000 | ORAL_TABLET | Freq: Every evening | ORAL | Status: DC
Start: 2021-01-09 — End: 2021-01-10
  Administered 2021-01-09: 2 via ORAL
  Filled 2021-01-09: qty 2

## 2021-01-09 MED ORDER — LIDOCAINE HCL 1 % IJ SOLN *I*
INTRAMUSCULAR | Status: DC | PRN
Start: 2021-01-09 — End: 2021-01-09
  Administered 2021-01-09 (×2): 1 mL via SUBCUTANEOUS

## 2021-01-09 MED ORDER — DEXTROSE 5 % FLUSH FOR PUMPS *I*
0.0000 mL/h | INTRAVENOUS | Status: DC | PRN
Start: 2021-01-09 — End: 2021-01-10

## 2021-01-09 MED ORDER — BISACODYL 10 MG RE SUPP *I*
10.0000 mg | Freq: Every day | RECTAL | Status: DC | PRN
Start: 2021-01-09 — End: 2021-01-10

## 2021-01-09 MED ORDER — ASPIRIN 81 MG PO TBEC *I*
81.0000 mg | DELAYED_RELEASE_TABLET | Freq: Two times a day (BID) | ORAL | Status: DC
Start: 2021-01-09 — End: 2021-01-10
  Administered 2021-01-09 – 2021-01-10 (×2): 81 mg via ORAL
  Filled 2021-01-09 (×2): qty 1

## 2021-01-09 MED ORDER — DOCUSATE SODIUM 100 MG PO CAPS *I*
200.0000 mg | ORAL_CAPSULE | Freq: Every day | ORAL | Status: DC
Start: 2021-01-09 — End: 2021-01-10
  Administered 2021-01-09 – 2021-01-10 (×2): 200 mg via ORAL
  Filled 2021-01-09 (×2): qty 2

## 2021-01-09 MED ORDER — TRAZODONE HCL 50 MG PO TABS *I*
50.0000 mg | ORAL_TABLET | Freq: Every evening | ORAL | Status: DC | PRN
Start: 2021-01-09 — End: 2021-01-10

## 2021-01-09 MED ORDER — EPHEDRINE 5MG/ML IN NS IV/IJ *WRAPPED*
INTRAMUSCULAR | Status: DC | PRN
Start: 2021-01-09 — End: 2021-01-09
  Administered 2021-01-09: 5 mg via INTRAVENOUS

## 2021-01-09 MED ORDER — BUPIVACAINE HCL 0.25 % IJ SOLUTION *WRAPPED*
Status: AC
Start: 2021-01-09 — End: 2021-01-09
  Filled 2021-01-09: qty 60

## 2021-01-09 MED ORDER — SENNOSIDES 8.6 MG PO TABS *I*
2.0000 | ORAL_TABLET | Freq: Every evening | ORAL | 0 refills | Status: DC
Start: 2021-01-09 — End: 2021-01-24
  Filled 2021-01-09: qty 100, 50d supply, fill #0

## 2021-01-09 MED ORDER — ONE STEP DOSE TO ADMINISTER *I*
INTRAMUSCULAR | Status: DC | PRN
Start: 2021-01-09 — End: 2021-01-09
  Administered 2021-01-09: 61 mL via INTRA_ARTICULAR

## 2021-01-09 MED ORDER — LIDOCAINE HCL 2 % IJ SOLN *I*
INTRAMUSCULAR | Status: DC | PRN
Start: 2021-01-09 — End: 2021-01-09
  Administered 2021-01-09: 50 mg via INTRAVENOUS

## 2021-01-09 SURGICAL SUPPLY — 61 items
APPLICATOR CHLORAPREP STER  ORANGE 26ML (Supply) ×9 IMPLANT
APPLICATOR CHLORAPREP STER 26ML (Supply) ×3 IMPLANT
BANDAGE SLF ADHER 6IN STER LF (Dressing) ×3 IMPLANT
BIT DRILL TWIST 2.4X127MM SS (Supply) ×3 IMPLANT
BLADE SAG NARR 12.45 X 1.19 X 82.18MM (Supply) ×3 IMPLANT
DRAPE SHEET 40X70 MED (Drape) ×3 IMPLANT
DRAPE SHEET 70X100 (Drape) ×4
DRAPE SPLIT 77X120 (Drape) ×2
DRAPE SUR W70XL100IN STD SMS POLYPR FULL SHT W/O FLD PCH DISP (Drape) ×2 IMPLANT
DRAPE SUR W77XL120IN SMS SPL W/ ADH DISP (Drape) ×1 IMPLANT
DRAPE SURG IOBAN 2 ANTIMIC LG 23 X 17IN (Drape) ×3 IMPLANT
DRESSING ABD PAD DERMACEA 5 X 9IN STER (Dressing) ×3 IMPLANT
DRESSING PICO W/PUMP 4"X12" (10CMX30CM) (Dressing)
DRESSING PICO W/PUMP 4INX12IN 10CMX30CM (Dressing) ×2 IMPLANT
DRESSING TEGADERM W/LABEL 4 X 4 3/4IN (Dressing) ×9 IMPLANT
ELECTRODE EXT BLADE SS 6.5IN (Supply) ×3 IMPLANT
FEMORAL HEAD 36/0MM DELTA CERAMIC (Implant) ×2 IMPLANT
FILTER NEPTUNE 4PORT MANIFOLD (Supply) ×3 IMPLANT
GLOVE BIOGEL PI MICRO IND UNDER SZ 6.5 LF (Glove) ×2 IMPLANT
GLOVE BIOGEL PI MICRO IND UNDER SZ 7.5 LF (Glove) ×2 IMPLANT
GLOVE BIOGEL PI MICRO SZ 6.5 (Glove) ×6 IMPLANT
GLOVE BIOGEL PI ORTHOPRO SZ 6 LF (Glove) ×2 IMPLANT
GLOVE BIOGEL PI ORTHOPRO SZ 8 LF (Glove) ×3 IMPLANT
GLOVE SURG BIOGEL PI ULTRATOUCH SZ 6.0 (Glove) ×6 IMPLANT
GLOVE SURG BIOGEL PI ULTRATOUCH SZ 7.5 (Glove) ×18 IMPLANT
GOWN SIRUS FABRIC REINFORCED SET IN LG (Gown) ×2 IMPLANT
GOWN SIRUS FABRIC REINFORCED SET IN XL (Gown) ×3 IMPLANT
HOOD SURGICAL W/PEEL AWAY FACE SHIELD T7PLUS (Supply) ×3 IMPLANT
HOOD W/ANTI-REFLECTIVE LENS T7PLUS (Supply) ×9 IMPLANT
LINER ACETABULAR G7 LONGEVITY POLYETHYLENE NEUTRAL 36MM SIZE D (Plate) ×2 IMPLANT
PACK CUSTOM TOTAL HIP TRAY (Pack) ×3 IMPLANT
PAD POST FOAM 6FT ROLL DISP (Other) ×3 IMPLANT
PEN SURG MARKER REG TIP (Other) ×3 IMPLANT
PROTECTOR ULNA NERVE ~~LOC~~ (Supply) ×3 IMPLANT
RETRIEVER HEWSON SUTR (Supply) ×3 IMPLANT
ROLL COTTON STERILE 1 LB QUALTEX (Dressing) ×1 IMPLANT
SCREW BNE 6.5X20MM CANCELLOUS SELF TAP TI TRILOGY ACETABULAR SYSTEM (Screw) ×2 IMPLANT
SCREW BNE L20MM DIA6.5MM ACET HIP TI ST TRIL (Screw) IMPLANT
SCREW BNE L30MM DIA6.5MM TIV ST COUNTERSUNK FOR ACET SYS (Screw) ×2 IMPLANT
SHELL ACETABULAR G7 PPS LTD 50D (Implant) ×2 IMPLANT
SLEEVE COMP KNEE MED BLENDED (Supply) ×3 IMPLANT
SOL H2O IRRIG STER 1000ML BTL (Solution) ×2
SOL POVIDONE STERILE 10 PCT 3/4OZ (Solution) ×3 IMPLANT
SOL SOD CHL IRRIG 500ML BTL (Solution) ×3 IMPLANT
SOL SOD CHL IV .9PCT 1000ML BAG (Drug) ×3 IMPLANT
SOL WATER IRRIG STERILE 1000ML BTL (Solution) ×4 IMPLANT
SPIKE MINI DISPENSING PIN (BBRAUN DP1000) (Supply) ×3 IMPLANT
STEM TPRLC 133 TYPE 1 PPS SO 9.0 (Implant) ×2 IMPLANT
STRAP REPLCMT LITHOTOMY 10 X 1.5IN (Supply) ×3 IMPLANT
SUCTION YANKAUER HIGH CAP. (Supply) ×3 IMPLANT
SUTR MONOCRYL PLUS 3-0PS2 27IN UNDY (Suture) ×3 IMPLANT
SUTR QUILL MONODERM PREC 2-0 24MM 30X30 (Suture) ×3 IMPLANT
SUTR VICRYL ANTIB 1 CTX 18 POP VIOLET (Suture) ×3 IMPLANT
SUTURE ETHBND EXCEL SZ 2 L30IN NONABSORBABLE GRN L40MM V-37 1/2 CIR TAPERCUT NDL POLY COAT (Suture) ×3 IMPLANT
SUTURE QUILL SZ 2 L2IN ABSRB VLT L48MM 1/2 CIR DA TAPR CUT NDL POLYDIOXANONE MFIL (Suture) ×3 IMPLANT
SYRINGE  LUER LOCK  STERILE  60ML (Syringe) ×4
SYRINGE 60ML  LUER LOCK  STERILE (Syringe) ×2 IMPLANT
SYRINGE IRRIG 60ML BULB SOFT PLIABLE ONE HANDED USE W/TYVEK LID (Syringe) ×3 IMPLANT
SYRINGE TB SAFETYGLIDE 1CC 27G X 1/2IN (Syringe) ×3 IMPLANT
TAPE DURAPORE 3IN SILK NL (Dressing) ×3 IMPLANT
TAPE SURG TRANSPORE 1IN X 10YD LF (Supply) ×3 IMPLANT

## 2021-01-09 NOTE — Op Note (Signed)
Date of Surgery: 01/09/2021  Surgeon: Laurance Flatten, MD   First Assistant: A. Dell Ponto, MD PGY IV  Second Assistant: L. Nersinger, PA     Pre-Op Diagnosis:   Left hip DJD     Anesthesia Type:   Spinal, General     Post-Op Diagnosis:   Same as Preop    Procedure(s) Performed :   Left THA     Approach:   Posterior     Intraoperative Findings:   1. End stage left hip arthrosis     Implants:   1. [employer] G7 acetabular shell size 50 mm   2. [employer] 6.5 mm transacetabular screws x 2 (30 mm, 20 mm)   3. [employer] Longevity polyethylene liner   4. [employer] taperloc complete size 9, STD  5. [employer] Ceramic head size 36 mm, STD taper    Estimated Blood Loss: 250  Specimens to Pathology: Yes  Patient Condition: good     Indications:   Lynn Wagner is a 65 y.o.  female with end stage left hip arthrosis refractory to conservative treatment, who elected to receive left total hip arthroplasty.     Description of Procedure:   Lynn Wagner was identified in the preoperative holding area and the intended surgical site was marked with the initials "Lynn Wagner". Further discussion of the risks related to this procedure were discussed with the patient at bedside. These risks include but are not limited to infection, bleeding requiring blood transfusion, nerve injury resulting foot drop, iatrogenic femur/acetabular fracture, inadvertent limb lengthening or shortening, hip dislocation, post-operative stiffness, need for further surgery, risks of anesthesia, blood clots, risk of exacerbation of underlying medical conditions resulting in heart attack, stroke, respiratory failure, and even death. Lynn Wagner understood these risks and elected to proceed. The patient was escorted to the operative suite, and was placed supine on the operating room table. Following induction of anesthesia, all bony prominences were well padded. The patient was then prepped and draped in the normal sterile fashion. Following the standard  pre-surgical pause and administration of pre-operative antibiotics a longitudinal incision was made 4 finger breadths lateral the the ASIS. Dissection was carried sharply down to the level of the IT fascia through skin and subcutaneous tissues. The IT fascia was incised at the anterior border. Cobra retractors were placed superior and inferior to the femoral neck. Pericapsular fat was excised.  Retractor was placed over the reflected head of rectus. L shaped Anterior capsulotomy was performed, and tag stiches placed. Retractors moved intra capsular, corkscrew placed into femoral head & the femoral neck osteotomy completed according to template. The femoral head was removed. Acetabular retractors were placed in acetabular safe zone with care to avoid injury to femoral nerve. The acetabular labrum was sharply excised and the acetabular fossa was cleared of soft tissue. The resected femoral head was measured, and a starting reamer was utilized to ream in a medial direction. Sequential acetabular reaming took place until a size suitable for a 1 mm press-fit of the implant listed above. Internal landmarks confirmed acetabular position. The final acetabular component listed above was impacted into position. Transverse acetabular screws listed above were inserted to augment fixation in the standard fashion. Drill holes were made, followed by depth gauge measurement & screw insertion. Fluoroscopy was utilized to confirm cup position and screw placement. The final polyethylene liner was impacted into position. Superior capsular & sequential release of piriformis, OI, & OE was performed until the femur was satisfactorily visualized/exposed,  and femoral retractors placed. Our attention then turned to the preparation of the femoral canal. A box osteotome was utilized to remove lateral neck remnant. Starting rasp was utilized to enter the femoral canal distally. The femur was sequentially broached with taperloc broaches,  alternating with suction of the canal. The 10 then 9 broach was noted to be longitudinally and rotationally stable and was left in place for trialing. STD neck option was chosen with -3 head selection. The hip was reduced and noted to be stable in position of flexion/adduction/and internal rotation, as well as extension and external rotation. The leg lengths were deemed satisfactory by palpattion of the legs (knee cap & malleoli). The final stem selection list above was impacted into place. Femoral head selections were re-trialed off the final stem. The final head selection listed above was impacted onto a clean dry taper. Fluoroscopy was utilized to confirm component position and leg length.The final implants achieved appropriate leg length fluoroscooy and palpation of legs. The wound was copiously irrigated with pulse lavage. IT fascia was repaired with #2 Quil suture. Tissue was locally infiltrated with .25% marcaine with epinephrine & Toradol. The remainder of the wound was closed in layers with, 0 Vicryl for deep subcutaneous tissue, and 2-0 quil, and 3-0 monocryl for skin closure. The skin was then sealed with dermabond and a sterile dressing was placed. The patient was weaned from his anesthesia and taken to the PACU in stable condition. There were no complications.     Infection Prevention Measurers:  SA/MRSA Screening  Antibiotics  Foot/toe covering  CHG clear Pre scrub  Positive Pressure Suiting with sleeves ioban sealed  CHG orange Prep  4 layers sterile draping including 1 impervious layer  CHG reprep following draping  Glove change prior to incision  Ioban layer  Efficient surgery time  Glove change prior to implant insertion  Suction tip change prior to implant insertion  Sterile betadine soak following implant insertion  Dermabond  Negative pressure occlusive dressing    Signed: Laurance Flatten, MD 01/09/2021 12:41 PM

## 2021-01-09 NOTE — INTERIM OP NOTE (Signed)
Procedure: L THA  Planned RTOR: no  Antibiotic Regimen: Post operative cefazolin x doses  Pain Management: Multimodal  DVT prophlyaxis:  ASA 81 mg BID   Weight Bearing Status and Activity Restrictions: WBAT at LLE no hip precautions  Dressing: PICO  Post-op x-rays and radiographic studies: XR in PACU   Diet: Advance as tolerated  Additional Specifications: None  1.     Interim Op Note (Surgical Log ID: [id number])       Date of Surgery: 01/09/2021       Surgeons: Surgeon(s) and Role:     * Laurance Flatten, MD - Primary     * Dell Ponto Tanya Nones, MD - Resident - Assisting   Assistants: PA First Assistant: Nersinger, Ardeth Perfect, PA       Pre-op Diagnosis: Pre-Op Diagnosis Codes:     * Primary osteoarthritis of left hip [M16.12]       Post-op Diagnosis: Post-Op Diagnosis Codes:     * Primary osteoarthritis of left hip [M16.12]       Procedure(s) Performed: Procedure(s) (LRB):  LEFT ARTHROPLASTY, HIP, TOTAL ANTERIOR APPROACH (Left)       Anesthesia Type: General        Fluid Totals: I/O this shift:  03/21 0700 - 03/21 1459  In: 1320 (16.1 mL/kg) [I.V.:1300; IV Piggyback:20]  Out: 250 (3 mL/kg) [Blood:250]  Net: 1070  Weight: 82.1 kg        Estimated Blood Loss: 250 mL       Specimens to Pathology:  ID Type Source Tests Collected by Time Destination   A : Femur Head TISSUE Bone SURGICAL PATHOLOGY Bascom Levels, RN 01/09/2021 1241           Temporary Implants:        Packing:                 Patient Condition: good       Findings (Including unexpected complications): No unexpected complications. See op note for detailed findings       Signed:  Rod Holler, MD  on 01/09/2021 at 1:18 PM

## 2021-01-09 NOTE — Anesthesia Procedure Notes (Signed)
---------------------------------------------------------------------------------------------------------------------------------------    AIRWAY   GENERAL INFORMATION AND STAFF    Patient location during procedure: OR       Date of Procedure: 01/09/2021 12:04 PM  CONDITION PRIOR TO MANIPULATION     Current Airway/Neck Condition:  Normal        For more airway physical exam details, see Anesthesia PreOp Evaluation  AIRWAY METHOD     Patient Position:  Sniffing    Preoxygenated: yes      Induction: IV  Mask Difficulty Assessment:  0 - not attempted    Number of Attempts at Approach:  1    Number of Other Approaches Attempted:  0  FINAL AIRWAY DETAILS    Final Airway Type:  LMA    Adjunct Airway: soft bite block    Final LMA: Unique    LMA Size: 3  ----------------------------------------------------------------------------------------------------------------------------------------

## 2021-01-09 NOTE — Invasive Procedure Plan of Care (Signed)
[facility]ITAL  [facility]ITAL  [facility]    CONSENT FOR MEDICAL  OR SURGICAL PROCEDURE                            Patient Name: Lynn Wagner  [facility] [address] MR                                                              DOB: 16-Nov-1955         Please read this form or have someone read it to you.   It's important to understand all parts of this form. If something isn't clear, ask [place] to explain.   When you sign it, that means you understand the form and give [place] permission to do this surgery or procedure.     I agree for Laurance Flatten, MD , and RNFA, PA, Orthopaedic Surgery Resident along with any assistants* they may choose, to treat the following condition(s): Left hip arthritis   By doing this surgery or procedure on me: Left total hip replacement   This is also known as: Left total hip arthroplasty (THA)   Laterality: Left     *if you'd like a list of the assistants, please ask. We can give that to you.    1. The care provider has explained my condition to me. They have told me how the procedure can help me. They have told me about other ways of treating my condition. I understand the care provider cannot guarantee the result of the procedure. If I don't have this procedure, my other choices are: No surgery, ongoing conservative care    2. The care provider has told me the risks (problems that can happen) of the procedure. I understand there may be unwanted results. The risks that are related to this procedure include: These risks include but are not limited to infection requiring IV antibiotics, and further surgery. We discussed nerve injury resulting in permanent leg weakness/foot drop, orthosis requirement, and gait impairment. Blood vessel injury requiring further surgery for limb salvage, bleeding requiring transfusion, iatrogenic femur and/or acetbular fracture, hip instability, limb lengthening, wound drainage, incomplete relief of symptoms, component failure, reflex sympathetic  dystrophy, risks of anesthesia, risk of blood clots in the legs and/or lungs, post-op ileus, risk of exacerbation of underlying medical issues including heart attack, stroke, respiratory failure, kidney failure, and even death.       3. I understand that during the procedure, my care provider may find a condition that we didn't know about before the treatment started. Therefore, I agree that my care provider can perform any other treatment which they think is necessary and available.    4. I give permission to the hospital and/or its departments to examine and keep tissue, blood, body parts, fluids or materials removed from my body during the procedure(s) to aid in diagnosis and treatment, after which they may be used for scientific research or teaching by appropriate persons. If these materials are used for science or teaching, my identity will be protected. I will no longer own or have any rights to these materials regardless of how they may be used.    5. My care provider might want a representative from a medical device  company to be there during my procedure. I understand that person works for:  [employer]        The ways they might help my care provider during my procedure include:        Helping the operating room staff prepare the special device my care provider wants to use and Providing information and support to operating room staff regarding the device    6. Here are my decisions about receiving blood, blood products, or tissues. I understand my decisions cover the time before, during and after my procedure, my treatment, and my time in the hospital. After my procedure, if my condition changes a lot, my care provider will talk with me again about receiving blood or blood products. At that time, my care provider might need me to review and sign another consent form, about getting or refusing blood.    I understand that the blood is from the community blood supply. Volunteers donated the blood, the  volunteers were screened for health problems. The blood was examined with very sensitive and accurate tests to look for hepatitis, HIV/AIDS, and other diseases. Before I receive blood, it is tested again to make sure it is the correct type.    My chances of getting a sickness from blood products are small. But no transfusion is 100% safe. I understand that my care provider feels the good I will receive from the blood is greater than the chances of something going wrong. My care provider has answered my questions about blood products.      My decision  about blood or  blood products   Yes, I agree to receive blood or blood products if my care provider thinks they're needed.        My decision   about tissue  Implants     Yes, I agree to receive tissue implants if my care provider thinks they're needed.          I understand this  form.    My care provider  or his/her  assistants have  explained:   What I am having done and why I need it.  What other choices I can make instead of having this done.  The benefits and possible risks (problems) to me of having this done.  The benefits and possible risks (problems) to me of receiving transplants, blood, or blood products.  There is no guarantee of the results.  The care provider may not stay with me the entire time that I am in the operating or procedure room.  My provider has explained how this may affect my procedure. My provider has answered my  questions about this.         I give my  permission for  this surgery or  procedure.            _______________________________________________                                     My signature  (or parent or other person authorized to sign for you, if you are unable to sign for  yourself or if you are under 44 years old)        ______           Date        _____        Time   Electronic Signatures will display at the bottom  of the consent form.    Care provider's statement: I have discussed the planned procedure, including the  possibility for transfusion of blood  products or receipt of tissue as necessary; expected benefits; the possible complications and risks; and possible alternatives  and their benefits and risks with the patients or the patient's surrogate. In my opinion, the patient or the patient's surrogate  understands the proposed procedure, its risks, benefits and alternatives.              Electronically signed by: Laurance Flatten, MD                                                01/09/2021         Date        10:07 AM        Time   Your doctor or someone your doctor has appointed has told you that you may need blood or a blood product transfusion, which has been collected from volunteers, as part of your treatment as a patient.    The reasons you might need blood or blood products include, but are not limited to:    Significant loss of your own blood   Your body may not be getting enough oxygen to its tissues    Treatment of bleeding disorders caused by low platelet counts or platelets that do not work right (platelets are part of a cell that helps to form clots and keeps you from bleeding too much).   You may not have enough of other substances that help your blood clot or stop you from bleeding more  The risks of getting a transfusion of blood or blood products include, but are not limited to:    Damage to the lungs   Difficulty breathing due to fluid in the lungs   The product may contain bacteria or rarely a virus (which includes HIV and Hepatitis)   Blood from the community blood supply has been collected from volunteer donors who have been screened for health risk. The blood has been tested for major blood transmitted disease, but no transfusion is 100% safe. The blood is tested with very sensitive and accurate tests to screen for hepatitis, AIDS, and other disease, which makes the risks very small.   You may have side effects from the transfusion (rash, fever, chills) or an allergic reaction   The  transfusion increases your risks of getting infection or cancer coming back   The transfusion can increase the time you have to stay in the hospital   The transfusion can potentially cause death if the wrong blood is given or your body rejects the blood   Before blood is transfused, it is tested again to make sure it is the correct type  There are other options than getting blood or blood products from other people and they include:    Drugs which can decrease bleeding   Drugs which can cause your body to make more blood (used in elective procedures with advance notice)   Autologous (your own blood) donation (needs pre-arrangement)   No transfusion  If you exercise your right to refuse to be transfused with blood or blood products; these things listed below, among others, could happen to you:   Your body may not get enough oxygen and suffer damage   You may have  a higher chance of bleeding   You may limit other options for your condition   You may die from losing too much blood

## 2021-01-09 NOTE — Anesthesia Postprocedure Evaluation (Signed)
Anesthesia Post-Op Note    Patient: Lynn Wagner    Procedure(s) Performed:  Procedure Summary  Date:  01/09/2021 Anesthesia Start: 01/09/2021 11:09 AM Anesthesia Stop: 01/09/2021  1:15 PM Room / Location:  [id number] / HH MAIN OR   Procedure(s):  LEFT ARTHROPLASTY, HIP, TOTAL ANTERIOR APPROACH Diagnosis:  Primary osteoarthritis of left hip [M16.12] Surgeon(s):  Laurance Flatten, MD  Rod Holler, MD Responsible Anesthesia Provider:  Stann Mainland, MD         Recovery Vitals  BP: 113/60 (01/09/2021  2:59 PM)  Heart Rate: 66 (01/09/2021  2:59 PM)  Heart Rate (via Pulse Ox): 59 (01/09/2021  2:00 PM)  Resp: 18 (01/09/2021  2:59 PM)  Temp: 36.6 C (97.9 F) (01/09/2021  2:59 PM)  SpO2: 98 % (01/09/2021  2:59 PM)  O2 Flow Rate: 2 L/min (01/09/2021  2:59 PM)   0-10 Scale: 4 (01/09/2021  3:26 PM)    Anesthesia type:  general  Complications Noted During Procedure or in PACU:  None   Comment:    Patient Location:  Med Surgical Floor  Level of Consciousness:    Recovered to baseline  Patient Participation:     Able to participate  Temperature Status:    Normothermic  Oxygen Saturation:    Within patient's normal range  Cardiac Status:   Within patient's normal range  Fluid Status:    Stable  Airway Patency:     Yes  Pulmonary Status:    Baseline  Neuraxial Block Evaluation:    No residual motor or sensory symptoms  Pain Management:    Adequate analgesia  Nausea and Vomiting:  None    Post Op Assessment:    Tolerated procedure well  Responsible Anesthesia Provider Attestation:  All indicated post anesthesia care provided         Complications Noted During Recovery Period:      None  Recovery from Anesthesia:      Recovered to baseline level of consciousness and No residual motor or sensory symptoms following neuraxial anesthesia  Condition of patient:      Recovered to pre-anesthetic condition and Satisfactory

## 2021-01-09 NOTE — Plan of Care (Signed)
Problem: Impaired Bed Mobility  Goal: STG - IMPROVE BED MOBILITY  Note: Patient will perform bed mobility without rails and the head of bed flat with Contact guard assist of 1    Time frame: 3-5 days     Problem: Impaired Transfers  Goal: STG - IMPROVE TRANSFERS  Note: Patient will complete Sit to stand transfers using a rolling walker with Modified independence     Time frame: 1-3 days     Problem: Impaired Ambulation  Goal: STG - IMPROVE AMBULATION  Note: Patient will ambulate 100-149 feet using a rolling walker with Modified independence    Time frame: 1-3 days       Problem: Impaired Stair Navigation  Goal: STG - IMPROVE STAIR NAVIGATION  Note: Patient will navigate 2 steps with No rail(s) and least restrictive assistive device and Minimal assist     Time frame: 1-3 days

## 2021-01-09 NOTE — Progress Notes (Signed)
[place]  Department of Orthopaedic Surgery         Orthopaedic Surgery Progress Note    Subjective:  S/P L THA. No acute events since surgery. Pain controlled. Denies fever/chills, shortness of breath/chest pain, nausea/vomiting.    Objective:    Vital Signs  Current Vitals Vitals Range (24 hours)   BP 113/60 (BP Location: Right arm)    Pulse 66    Temp 36.6 C (97.9 F) (Temporal)    Resp 18    Ht 1.588 m (5' 2.5")    Wt 82.1 kg (181 lb)    SpO2 98%    BMI 32.58 kg/m  BP: (102-136)/(60-93)   Temp:  [36 C (96.8 F)-36.6 C (97.9 F)]   Temp src: Temporal (03/21 1459)  Heart Rate:  [58-68]   Resp:  [16-19]   SpO2:  [94 %-98 %]   Height:  [158.8 cm (5' 2.5")]   Weight:  [82.1 kg (181 lb)]      Intake/Output:  Date 01/08/21 1500 - 01/09/21 0659(Not Admitted) 01/09/21 0700 - 01/10/21 0659   Shift 1500-2259 2300-0659 24 Hour Total 0700-1459 1500-2259 2300-0659 24 Hour Total   INTAKE   P.O.    0 240  240     P.O.    0 240  240   I.V.    1300(2)   1300     Volume (mL) (Lactated Ringers Infusion)    1300   1300   IV Piggyback    20   20     Volume (mL) (ceFAZolin (ANCEF) 2,000 mg in sterile water (PF) IV syringe 20 mL)    20   20   Shift Total(mL/kg)    1320(16.1) 240(2.9)  1560(19)   OUTPUT   Urine    0(0)   0     Urine    0   0     Pre-Void Bladder Scan Reading    305 ml   305 ml   Emesis/NG output    0   0     Emesis    0   0   Stool    0   0     Stool (mL)    0   0   Blood    250   250     Blood Loss    250   250   Shift Total(mL/kg)    250(3)   250(3)   NET    1070 240  1310   Weight (kg)    82.1 82.1 82.1 82.1       Physical Exam:  General: Alert, no acute distress, supine in bed.  Cardiovascular: Warm and well-perfused  Respiratory: Comfortable on room air    LLE:   Dressing clean, dry, intact.   Motor intact hip and knee flexion/extension, ankle dorsiflexion/plantarflexion, toe flexion/extension.   Sensation intact to light touch 1st dorsal web space/medial/lateral/plantar/dorsal foot.  2+  dorsalis pedis/posterior tibialis pulse, toes warm and well-perfused, < 3 second capillary refill.    Medications  Scheduled:    [START ON 01/10/2021] psyllium  1 packet Oral Daily    docusate sodium  200 mg Oral Daily    senna  2 tablet Oral Nightly    polyethylene glycol  17 g Oral Daily    acetaminophen  1,000 mg Oral 3 times per day    morphine  15 mg Oral 2 times per day    [START ON 01/10/2021] meloxicam  15 mg Oral  Daily    ceFAZolin IV  2,000 mg Intravenous Q8H    aspirin EC  81 mg Oral BID    lubiprostone  24 mcg Oral BID WC       Continuous Infusions:    sodium chloride 125 mL/hr (01/09/21 1404)       PRN: sodium chloride, dextrose, sodium chloride, dextrose, bisacodyl, traZODone, ondansetron, oxyCODONE **OR** oxyCODONE, camphor-menthol, calcium carbonate    Laboratory studies:  No results for input(s): WBC, HGB, HCT, PLT in the last 168 hours.  No results for input(s): INR, PTT, PTI in the last 168 hours.   No results for input(s): NA, K, CL, CO2, UN, CREAT, GLU, CA, MG, PO4 in the last 168 hours.      No results for input(s): WBC, ESR, CRP in the last 168 hours.    Imaging:  PACU XR shows appropriate alignment of implant     Assessment/Plan: Lynn Wagner is a 65 y.o. female now Day of Surgery  s/p L THA.           Pain control: Multimodal   Diet: Regular    Abx: Cefazolin x 2 doses    Home meds: Reviewed    Xrays reviewed   Weight-bearing status: WBAT LLE, ice/elevate   PT/OT & OOB   Bowel regimen onboard   DVT ppx: ASA 81 mg BID x 4 weeks   Dispo: Pending PT and pain control      Buddy Duty, MD  Orthopaedic Surgery Resident  01/09/2021 at 4:52 PM

## 2021-01-09 NOTE — Progress Notes (Signed)
PRE-OPERATIVE JOINT REPLACEMENT CHECKLIST    SITE: left hip                                      DIAGNOSIS: left hip osteoarthritis    HISTORY:  Description of pain: Dull  Pain Frequency: Continuous  Pain Intensity: 10/10  Onset: > 6 months  Duration: > 6 months                  Aggravating factors: prolonged standing, prolonged ambulation, climbing up and down stairs, getting into and out of a vehicle                            Relieving factors: Rest  Limitation of ADLs: Yes, difficulty with grooming, work, homemaking, placing shoes on and off, and leisure activities  Safety Issues (falls): no   Contraindication to non-surgical treatments:no   Failed non-surgical treatments:     Medications/NSAIDs: Yes    Weight loss: No           Physical therapy: Yes     Activity modification program: > 6 months    Assisted ambulatory device: no,    Intra-articular injection: No, patient refused   Braces/orthotics: No, not indicated    PHYSICAL EXAM OPERATIVE EXTREMITY:  Gait Description: Antalgic, trendelenberg  Hip ROM:    Flexion: 120 degrees   Internal Rotation: 10 degrees   External rotation: 20 degrees  Greater trochanteric tenderness: Mild  Limb Length: Left leg shortening  Alignment: Neutral  Crepitus: None  Effusions: Unable to determine clinically     RADIOGRAPHS/INVESTIGATIONS: Radiographs demonstrate: left hip arthrosis - severe    RATIONALE FOR DEVIATING FROM A STEPPED-CARE APPROACH: none     Laurance Flatten, MD 01/09/2021 12:40 PM

## 2021-01-09 NOTE — Progress Notes (Signed)
Lynn Wagner was seen and evaluated at bedside in pre-op holding, H&P reviewed, no changes noted.  We reviewed risks related to total hip arthroplasty (THA) including infection requiring further surgery and prolonged morbidity, bleeding requiring transfusion, nerve injury resulting in permanent leg weakness/foot drop & gait impairment, blood vessel injury requiring further surgery, iatrogenic femur/acetabular fracture, hip instability, limb length inequality, risk of anesthesia, blood clots in the legs and/or lungs, post op ileus, risk of exacerbation of underlying medical issues resulting in heart attack, stroke, respiratory failure, kidney failure, and even death. The patient understood these risks and elected to proceed. Informed consent was obtained verbally, the surgical site initialed, plan to proceed with left THA.    Laurance Flatten, MD 01/09/2021 10:08 AM

## 2021-01-09 NOTE — Progress Notes (Signed)
Physical Therapy    Initial Eval Completed.  Patient is a 65 y.o.female who presents with decreased mobility after a left anterior THA by Dr Ardine Eng.    Discharge recommendation:  Intermittent supervision/assist, Anticipate return to prior living arrangement, Home PT  Equipment recommendations upon discharge: None  Mobility recommendations for nursing while in hospital: SBA RW    Past Medical History:   Diagnosis Date    Primary osteoarthritis of left hip 12/22/2020       Past Surgical History:   Procedure Laterality Date    BREAST SURGERY      lumpectomy    HYSTERECTOMY      JOINT REPLACEMENT Bilateral     knees    OTHER SURGICAL HISTORY      thumb tendo repair    ROTATOR CUFF REPAIR Left        *Bold Indicates co-morbidities affecting treatment and recovery    Comorbidities affecting treatment/recovery in addition to those listed above:  none noted    Personal factors affecting treatment/recovery:   none identified    Clinical presentation:   evolving       Patient complexity:  low level as indicated by above personal factors, environmental factors and comorbidities in addition to their impairments found on physical exam.     PT Adult Assessment - 01/09/21 1547        Prior Living     Prior Living Situation Reported by patient     Lives With Spouse     Receives Help From Independent     Type of Home 2 Story Home   Dos not need to go upstairs    # Steps to Enter Home 2     # Rails to [employer] 0     # Of Steps In Home 13     # Rails in Home 1     Medical Equipment in Home Cane;Rolling walker   comfort height toilets       Prior Function Level    Prior Function Level Reported by patient     Transfers Independent     Transfer Devices None     Walking Independent     Walking assistive devices used None     Stair negotiation Independent     Additional Comments Pt used st cane for the last week.        PT Tracking    PT TRACKING PT Assigned     Type of Session evaluation     SW Request? No        Treatment Day     Treatment Day (HH / URR) 1        Precautions/Observations    Precautions used No     Was patient wearing a mask? Yes     PPE worn by Leisure centre manager;Face Shield;Mask        Pain Assessment    *Is the patient currently in pain? Yes     Pain (Before,During, After) Therapy Before;During;After     0-10 Scale 4;2   2 at rest, 4 with gait    Pain Location Hip     Pain Orientation Anterior;Inner     Pain Descriptors Aching     Pain Intervention(s) Cold applied;Repositioned;Refer to nursing for pain management        Vision     Current Vision Wears corrective lenses        Cognition    Cognition Tested     Arousal/Alertness Appropriate responses to stimuli  Orientation Alert and oriented x4     Following Commands Follows all commands and directions without difficulty        UE Assessment    UE Assessment Full range RUE AROM;Full range LUE AROM   B UE strength WFL       LE Assessment    LE Assessment Full AROM RLE;Full PROM RLE;Impaired AROM LLE;Impaired PROM LLE   Left hip NT to endrange, hip strength grossly 2+/5       Bed Mobility    Bed mobility Tested     Supine to Sit Stand by assistance;Contact guard;Head of bed elevated;Side rails up (#)     Additional comments Pt in bedside chair after PT        Transfers    Transfers Tested     Sit to Stand Contact guard;Verbal cues     Stand to sit Contact guard;Verbal cues     Transfer Assistive Device rolling walker     Additional comments Cues for proper hand placement. The pt progresses through the egress test to begin walking        Mobility    Mobility Tested     Weight Bearing Status LLE     Weight Bearing Status LLE Left Lower Extremity - Weight bearing as tolerated     Gait Pattern 3 point;Antalgic left side;Decreased cadence;Decreased R step length;Decreased R step height;Decreased L step length;Decreased L step height     Ambulation Assist Contact guard     Ambulation Distance (Feet) 100     Ambulation Assistive Device rolling walker        Family/Caregiver  Training`    Patient/Family/Caregiver training Yes     Patient training Role of physical therapy in hospital and plan for evaluation and follow up;Discharge planning;PT plan of care after evaluation;Post-operative precautions;Purpose and importance of icing and recommendations for ice frequency and duration;Use of assistive device;Equipment recommendations for discharge;Therapeutic exercises, including recommendation of frequency of therapeutic exercises to be completed by patient throughout day;Benefits of oob activity and mobility during hospitalization, including frequency of ambulation and change of position;Call don't fall, purpose of bed/chair alarm, and recommendation for nursing assistance during all oob mobility        Therapeutic Exercises    Exercises Performed LE     Ankle Pumps Bilaterally     Ankle Pumps-Assist Active     Ankle Pumps-Reps 10     Glut Sets-Reps 10     Quad Sets Bilaterally     Quad Sets-Reps 10        Balance    Balance Tested     Sitting - Static Standby assist     Sitting - Dynamic Contact guard     Standing - Static Contact guard;Standby assist;Supported     Standing - Dynamic Standby assist;Contact guard;Supported        PT AM-PAC Mobility    Turning over in bed? 1     Sitting down on and standing up from a chair with arms? 3     Moving from lying on back to sitting on the side of the bed? 1     Moving to and from a bed to a chair? 3     Need to walk in hospital room? 3     Climbing 3 - 5 steps with a railing? 2     Total Raw Score 13     Standardized Score - Calculated 36.74     % Functional Impairment - Calculated 65%  Gait Speed    Assistive Device rolling walker     Distance Walked (meters) 3.05 m     Time it took to walk distance (seconds) 15.6 sec     Gait speed (m/sec) 0.2 m/sec        Assessment    Brief Assessment Appropriate for skilled therapy     Problem List Impaired LE ROM;Impaired LE strength;Impaired transfers;Impaired ambulation;Impaired functional  status;Impaired mobility;Impaired bed mobility;Impaired stair navigation;Impaired functional mobility;Pain contributing to impairment     Patient / Family Goal home plan tomorrow        Plan/Recommendation    PT Treatment Interventions Restorative PT;AROM;Bed mobility training;Transfers training;Stair training;Pt/Family education;Strengthening;Gait training;Home exercise program instruction;D/C planning;Will work to minimize pain while promoting mobility whenever possible     PT Frequency 5-7x/wk;BID Visit     PT Mobility Recommendations SBA RW     PT Referral Recommendations OT;SW;Home care     PT Discharge Recommendations Intermittent supervision/assist;Anticipate return to prior living arrangement;Home PT     PT Discharge Equipment Recommended None     PT Assessment/Recommendations Reviewed With: Family;Nursing;Patient     Next PT Visit Progress per protocol     PT needs to see patient prior to DC  Yes        Time Calculation    PT Untimed Codes 40     PT Unbilled Time 0     PT Total Treatment 40        Plan and Onset date    Plan of Care Date 01/09/21     Onset Date 01/09/21     Treatment Start Date 01/09/21               AMPAC Score Interpretation:  Adapted from Roxanne Gates, et al Association of AM-PAC 6 Clicks Basic Mobility and Daily Activity Scores with discharge destination, 2021  AM-PAC Raw Score of > 16 (standardized score > 40.78, % impairment <  54%) indicates a high likelihood of discharge to home     Adapted from Loma Sender al Prediction of disposition within 48-hours of hospital admission using patient mobility scores, 2019  AM-PAC raw score  ? 15 (standardized score ? 39, % impairment  < 58%) indicates a high probability of a discharge to home  AM-PAC raw score 9-14 (standardized score ? 31, % impairment 81%-61%) and age < 25 is more likely to discharge to home  AM-PAC raw score 9-14 (standardized score ? 31, % impairment 81%-61%) and age > 39 is more likely to require post-acute care  AM-PAC raw  score < 9 (standardized score < 31, % impairment > 81%) indicates pt is more likely to require post-acute care      Gait Speed Score Interpretation:  Leodis Liverpool, Kennyth Arnold PT, PhD; Rexanne Mano PT, PhD. Walking Speed: the Sixth Vital Sign." Journal of Geriatric Physical Therapy 32. 2 (2009) : 2-9.)   0 m/s -0.4 m/s: Household walker; Needs intervention to reduce fall risk; Dependent with ADL's and IADL's   0.4 m/s -0.6 m/s: [employer] Tourist information centre manager; Needs intervention to reduce fall risk   0.6 m/s -1.0 m/s: [employer]; Needs intervention to reduce fall risk   m/s- 1.4 m/s: Normal Walking speed, Cross the [employer], Less likely to have adverse events.       Duncan Dull, MSPT  Web Page or Secure Chat Duncan Dull

## 2021-01-09 NOTE — Anesthesia Procedure Notes (Signed)
---------------------------------------------------------------------------------------------------------------------------------------    NEURAXIAL BLOCK PLACEMENT  Single Shot Spinal    Date of Procedure: 01/09/2021 12:00 PM    Patient Location:  OR    Reason for Block: at surgeon's request    CONSENT AND TIMEOUT     Consent:  Obtained per policy    Timeout: patient identified (name/DOB) , proper patient position verified, needed equipment, monitors, medications and access verified as present and functioning , allergies reviewed with patient/record  and anticoagulation/antiplatelet status reviewed  METHOD:    Patient Position: sitting    Monitoring: blood pressure and continuous pulse oximetry      Labeling: syringes appropriately labeled    Sedation Used: yes            For medications used, please see MAR      Level of Sedation: moderate      Sterile Technique:  Hand sanitizing, hat, mask and sterile gloves    Prep: povidone-iodine      Successful Approach: midline    Successful Location: L3-4    Attempts (Skin Punctures):  2    Attempted Approach:  midline    Attempted Location: L2-3     Lot Number: [id number]; Expiration Date: 2023-04-21      SPINAL NEEDLE:      Introducer: 20 G    Type: Pencan    Length: 3.5 in  OBSERVATIONS:    Block Completion:  Successfully completed      Paresthesia: none      Patient Reaction to Block: tolerated procedure well, pain relief and vitals remained stable    ADDITIONAL COMMENTS:                                                                                                                                                                                                                              STAFF     Performed by  Stann Mainland, MD  ----------------------------------------------------------------------------------------------------------------------------------------

## 2021-01-09 NOTE — Anesthesia Case Conclusion (Signed)
CASE CONCLUSION  Emergence  Actions:  LMA removed  Criteria Used for Airway Removal:  Adequate Tv & RR, acceptable O2 saturation and following commands  Assessment:  Routine  Transport  Directly to: PACU  Airway:  Facemask  Oxygen Delivery:  10 lpm  Position:  Recumbent  Patient Condition on Handoff  Level of Consciousness:  Moderately sedated  Patient Condition:  Stable  Handoff Report to:  RN

## 2021-01-09 NOTE — Plan of Care (Signed)
Home Health Assessment    Completed by: Aldous Housel, RN- [facility] home care coordinator  Phone: [phone]    Referred by: acute care coordinator    Source of Information: medical record      Home Health indicators present: s/p L THA    Barriers to discharge to be addressed: medical/therapy clearance for home     Plan: home care nursing/therapy  for assessment/teaching at discharge      Comments: medical record reviewed for home care  Will follow  with pt re d/c plan/home care needs

## 2021-01-10 ENCOUNTER — Other Ambulatory Visit: Payer: Self-pay

## 2021-01-10 ENCOUNTER — Encounter: Payer: Self-pay | Admitting: Orthopedic Surgery

## 2021-01-10 DIAGNOSIS — R001 Bradycardia, unspecified: Secondary | ICD-10-CM

## 2021-01-10 DIAGNOSIS — R112 Nausea with vomiting, unspecified: Secondary | ICD-10-CM

## 2021-01-10 LAB — MCHC: MCHC: 33 g/dL (ref 32–36)

## 2021-01-10 LAB — BASIC METABOLIC PANEL
Anion Gap: 12 (ref 7–16)
CO2: 23 mmol/L (ref 20–28)
Calcium: 8.6 mg/dL (ref 8.6–10.2)
Chloride: 106 mmol/L (ref 96–108)
Creatinine: 0.77 mg/dL (ref 0.51–0.95)
Glucose: 145 mg/dL — ABNORMAL HIGH (ref 60–99)
Lab: 15 mg/dL (ref 6–20)
Potassium: 4.1 mmol/L (ref 3.3–5.1)
Sodium: 141 mmol/L (ref 133–145)
eGFR BY CREAT: 86 *

## 2021-01-10 LAB — HCT AND HGB
Hematocrit: 34 % (ref 34–45)
Hemoglobin: 11.2 g/dL (ref 11.2–15.7)

## 2021-01-10 MED ORDER — MELOXICAM 15 MG PO TABS *I*
15.0000 mg | ORAL_TABLET | Freq: Every day | ORAL | 0 refills | Status: DC
Start: 2021-01-10 — End: 2021-01-24
  Filled 2021-01-10: qty 14, 14d supply, fill #0

## 2021-01-10 MED ORDER — OXYCODONE HCL 5 MG PO TABS *I*
5.0000 mg | ORAL_TABLET | ORAL | 0 refills | Status: DC | PRN
Start: 2021-01-10 — End: 2021-01-24
  Filled 2021-01-10: qty 42, 7d supply, fill #0

## 2021-01-10 MED ORDER — SODIUM CHLORIDE 0.9 % 25 ML IV SOLN *I*
12.5000 mg | Freq: Four times a day (QID) | INTRAVENOUS | Status: DC | PRN
Start: 2021-01-10 — End: 2021-01-10
  Filled 2021-01-10: qty 1

## 2021-01-10 MED ORDER — FAMOTIDINE 20 MG PO TABS *I*
20.0000 mg | ORAL_TABLET | Freq: Two times a day (BID) | ORAL | 0 refills | Status: DC
Start: 2021-01-10 — End: 2021-08-06
  Filled 2021-01-10: qty 60, 30d supply, fill #0

## 2021-01-10 MED ORDER — FAMOTIDINE (PF) 20 MG/2ML IV SOLN *I*
20.0000 mg | Freq: Once | INTRAVENOUS | Status: AC
Start: 2021-01-10 — End: 2021-01-10
  Administered 2021-01-10: 20 mg via INTRAVENOUS
  Filled 2021-01-10 (×5): qty 2

## 2021-01-10 NOTE — Plan of Care (Signed)
Medical record reviewed for home care, met with pt/family re home care services, progress to d/c   Will follow and complete home care referral   Wallie Char, RN [facility] home care coordinator

## 2021-01-10 NOTE — Discharge Summary (Signed)
Name: TORIAN THOENNES MRN: [id number] DOB: 1955/10/29     Admit Date: 01/09/2021   Date of Discharge: 01/10/2021     Patient was accepted for discharge to   [facility] [[facility]]           Discharge Attending Physician: Laurance Flatten, MD      Hospitalization Summary    CONCISE NARRATIVE: MAKALEIGH REINARD was admitted for elective arthroplasty. The patient developed epigastric pain, nausea/vomiting with diaphoresis on POD 1. EKG with sinus bradycardia and labs without abnormalities. The patient was treated with anti-emetics and pepcid, symptoms improved. Patient was discharged to home          OR PROCEDURE: Left Anterior Total Hip Arthroplasty 01/09/2021              SIGNIFICANT MED CHANGES: Yes  Chemical DVT prophylaxis was initiated. Detailed in [facility]. Analgesics tailored to tolerance and adequate pain management.          Signed: Freeman Caldron, PA  On: 01/10/2021  at: 1:06 PM

## 2021-01-10 NOTE — Progress Notes (Signed)
Occupational Therapy Evaluation    Initial Eval Completed.  Patient is a 65 y.o.female who presents with s/p L aTHA on 01/09/2021 by Dr. Noni Saupe. LLE WBAT, anterior approach, no hip precautions, raised toilet seat x6 weeks, POD #1.    Pt demonstrates safe and adequate independence with ADL's to return home with intermittent supervision/assist from family as needed. Pt cleared OT.       Discharge recommendation:  Prior Living Environment, Intermittent supervision   Equipment recommendations: Sock aid, Raised toilet seat (Husband ordering from [facility] Stay Recommendations for nursing: 1A to bathroom with RW, encourage active participation with ADLs    Past Medical History:   Diagnosis Date    Primary osteoarthritis of left hip 12/22/2020       Past Surgical History:   Procedure Laterality Date    BREAST SURGERY      lumpectomy    HYSTERECTOMY      JOINT REPLACEMENT Bilateral     knees    OTHER SURGICAL HISTORY      thumb tendo repair    PR TOTAL HIP ARTHROPLASTY Left 01/09/2021    Procedure: LEFT ARTHROPLASTY, HIP, TOTAL ANTERIOR APPROACH;  Surgeon: Arley Phenix, MD;  Location: [facility] MAIN OR;  Service: Orthopedics    ROTATOR CUFF REPAIR Left        *Bold Indicates co-morbidities affecting treatment and recovery    Occupational profile relating to the present problem:   Needs assistance for mobility    In addition to the PMH and surgical history listed above, the comorbidities affecting treatment/recovery:   Total hip replacement      Performance deficits that result in activity limitations and/or performance restrictions:  None    Modification of tasks needed or assistance with assessments necessary to enable completion of evaluation component:  Increased time/effort for ADL completion with AE, functional mobility MOD I with RW    Patient complexity:  low level as indicated by  personal factors, environmental factors and comorbidities in addition to their impairments found on physical exam.     OT  Assessment ([place] / URR) - 01/10/21 1222        OT Tracking  ([place] / URR)    OT Tracking ([place] / URR) OT Discontinue     Type of Session evaluation with treatment     SW Request? No        OT Last Visit    Visit (#)  0        Precautions    Precautions used Yes     Total Hip Replacement NO HIP PRECAUTIONS;Direct anterior   Raised toilet seat x6 weeks    Weight Bearing Status Left Lower Extremity - Weight bearing as tolerated     Fall Precautions General falls precautions     LDA Observation --   PICO    Other Pt denies lightheadedness/dizziness/nausea     Patient Wearing Mask Yes     Writer wearing PPE including Gloves;Shield;Mask     Activity Order Activity as tolerated        Home Living (Prior to Admission)    Prior Living Situation Reported by patient;Obtained via chart review     Type of Home 2 Story home     Location of Bedroom First floor     Location of Bathroom First floor     # Steps to Enter Home 2     # Of Steps In Home 13   does not use stairs  Bathroom Shower/Tub Optician, dispensing seat;Hand-held shower;Comfort [employer] in [place];Adaptive Equipment   reacher    Bathroom Accessibility Accessible via walker     Additional Comments was using cane for past week and a half        Prior Function    Prior Function Reported by patient;Obtained during chart review     Level of Independence Independent with ADLs;Independent with ADL functional transfers;Independent ambulation;Independent with assistive device;Independent with homemaking with ambulation;Driving     Lives With [employer] Help From Independent     IADL Independent     Vocational Retired     Additional Comments Spouse can assist PRN upon d/c        Pain Assessment    *Is the patient currently in pain? Yes     Pain Location 1 Hip     Orientation of Pain 1 Left     Pain Scale 1 2     Pain 1 Before     Pain Intervention(s) 1 Cold applied;Repositioned;Refer to nursing  for pain management     Additional Comments Pt edu on icing protocol        Vision     Current Vision No visual deficits        Cognition    Cognition No deficit noted     Level of Alertness Appropriate responses to stimuli     Orientation Alert and oriented x4     Attention  Appears intact     Memory Appears intact     Following Commands Follows mulitstep commands     Additional Comments Pt pleasant and motivated to participate in OT        Perception    Perception No deficit noted        Coordination    Coordination Additional Comments     Additional comments [facility]        Sensation    Sensation No apparent deficit        UE Assessment    UE Assessment Full AROM RUE;Full AROM LUE     Additional Comments BUE strength WFL during functional transfers        Bed Mobility    Supine to Sit Modified Independent;HOB elevated     Sit to Supine Modified Independent;HOB elevated     Additional Comments Pt greeted supine in bed. Pt ended supine in bed with ice on L hip, call bell within reach, and spouse present.         Functional Transfers    Sit to Stand Modified Independent;Rolling Walker   x5 EOB    Stand to Sit Modified Independent;Rolling Walker   x5 EOB    Toilet Transfers Modified Independent;Rolling Walker   Over toilet commode    Functional Mobility Modified Independent;Rolling Walker   to/from Nurse, children's edu pt on MD's order for raised toilet seat x6 weeks - husband ordering from [employer] during session.         Balance    Sitting - Static Independent      Sitting - Dynamic Independent     Standing - Static Supported;Unsupported   MOD I, RW    Standing - Dynamic Supported;Unsupported   MOD I, RW    Standing Tolerance during Functional Task Good     Additional Comments no noted LOB  ADL Assessment    UE Dressing Modified independent     Where UE Dressing Assessed Standing     Assist Needed With: Increased time;Verbal cues     LE Dressing Modified independent     Where  LE  Dressing Assessed Edge of bed;Standing     Assist Needed With: Verbal cueing;Use of adaptive equipment;Increased time;Sequencing     Equipment Used Associate Professor;Walker     Additional Comments Pt edu on TEDs purpose/wearing schedule/donning technique, both TEDs donned - pt's husband states can assist PRN. Pt donned pants MOD I with increased time/effort and verbal cueing for sequence and edu on PICO line management - writer verbally edu pt on use of reacher to assist with donning underwear/pants but pt did not require use. Pt doffed both socks MOD I using reacher and donned MOD I using sock aid with verbal cues for edu on technique.         IADL Assessment    Additional Comments Spouse can assist PRN         Activity Tolerance    Endurance Tolerates 30 min activity with multiple rests     Additional Comments No noted SOB/fatigue        Functional Outcomes Measures    Functional Outcome Measures Yes        OT AM-PAC Self Care    Putting on and taking off regular lower body clothing? 3     Bathing (including washing, rinsing, drying)? 3     Toileting, which includes using toilet, bedpan, or urinal? 4     Putting on and taking off regular upper body clothing? 4     Taking care of personal grooming such as brushing teeth? 4     Eating Meals 4     Total Raw Score 22     CMS Score - Calculated 25.80%        OT Treatment Assessment    Assessment D/C acute OT;Patient has met all inpatient goals        Plan    OT Frequency One-time visit     No acute OT needs No acute OT goals identified;Pt demonstrates adequate ADL skills for return to prior living environment        Recommendation    OT Discharge Recommendations Prior Living Environment;Intermittent supervision     OT Discharge Equipment Recommended Sock aid;Raised toilet seat   Husband ordering from [facility] Stay Recommendations 1A to bathroom with RW, encourage active participation with ADLs     OT Next Visit 0     OT needs to see patient prior to DC  No         Multidisciplinary Communication    Multidisciplinary Communication RN, PT, patient, patient's spouse        Patient/Family/Caregiver Training    Patient/Family/Caregiver training Yes     Patient/Family/Caregiver training Role of occupational therapy in hospital and plan for evaluation;Discharge planning;Post-operative precautions;Purpose of and importance of icing and recommendations for frequency;Use of assistive device;Use of Adaptive equipment;Equipment recommendations for discharge;Benefit of OOB activity and participation in ADLs while inpatient;Compression stocking education and wearing schedule;Call don't fall, purpose of bed/chair alarm, and recommendations for nursing;Pressure relief and importance of repositioning        Time Calculation    OT Timed Codes 9     OT Untimed Codes 20     OT Unbilled Time 0     OT Total Treatment 29  Plan and Onset date    Plan of Care Date 01/10/21     Onset Date 01/09/21     Treatment Start Date 01/10/21               Glenard Haring, OTR/L  Secure chat to Glenard Haring or web page to [phone]      [facility] Score Interpretation:  Adapted from Worthy Flank, et al Association of AM-PAC 6 Clicks Basic Mobility and Daily Activity Scores with discharge destination, 2021  AM-PAC Raw Score of > 19 (standardized score > 40.22 %) indicates a high likelihood of discharge to home     Adapted from Haring "Validity of h AM-PAC "6- Clicks" Inpatient Daily Activity and Basic Mobility Short Forms (2014)   AM-PAC raw score  ? 18 (standardized score ? 39, % impairment  < 47%) indicates a high probability of a discharge to home  AM-PAC raw score  ? 13 (standardized score ? 32.03, % impairment 63%) indicates pt is more likely to require IRF/SNF R  AM-PAC raw score <12 (standardized score < 30.6 % impairment > 66%) indicates pt is more likely to require [place]

## 2021-01-10 NOTE — Progress Notes (Addendum)
Notified by nursing that patient developed sudden nausea, abdominal pain, dizziness, and diaphoresis. Zofran was given and patient continues with intermittent symptoms. One episode of vomiting.    Patient evaluated at bedside. She reports she drank coffee this morning and suddenly felt nauseas, abdominal pain, sweaty, and shaky. She reports minimal relief from Zofran. Vomit appeared to be mix of coffee and peaches she ate last night. She continues to have epigastic abdominal pain, and intermittently feels shaky and sweaty. She was dizzy upon getting up to go to the bathroom. Voiding without any problems. She denies chest pain, SOB, HA, diplopia. She does not typically have reflux or heartburn. She denies problems with post-op nausea/vomiting with previous surgeries. She is passing some flatus, but admits it is much less than usual.    On exam:  General: Sitting in bed, non-diaphoretic, alert and oriented  Heart: RRR, no murmur  Abdomen: active bowel sounds in all 4 quadrants, non-tender to palpation    Patient Vitals for the past 72 hrs:   BP Temp Temp src Pulse Resp SpO2 Height Weight   01/10/21 0734 140/60 -- -- -- -- -- -- --   01/10/21 0701 114/68 36.2 C (97.2 F) TEMPORAL -- 18 95 % -- --   01/10/21 0351 -- -- -- -- 18 -- -- --   01/09/21 2308 105/57 36.2 C (97.2 F) TEMPORAL 58 16 97 % -- --   01/09/21 1459 113/60 36.6 C (97.9 F) TEMPORAL 66 18 98 % -- --   01/09/21 1400 110/73 36 C (96.8 F) TEMPORAL 58 19 94 % -- --   01/09/21 1345 124/72 -- -- 61 17 97 % -- --   01/09/21 1330 113/67 -- -- 61 18 94 % -- --   01/09/21 1315 102/66 36 C (96.8 F) TEMPORAL 68 17 96 % -- --   01/09/21 0941 (!) 136/93 -- TEMPORAL 64 16 97 % 1.588 m (5' 2.5") 82.1 kg (181 lb)       A/P: POD 1 Left anterior total hip arthroplasty with post-op nausea/vomiting  -MScontin discontinued  -Phenergan ordered 2nd line for nausea  -Restart maintenance fluids Normal Saline at 186ml/hour  -Discussed with patient to start with liquids  only and advance diet slowly once feeling better  -Will get EKG, as well as updated BMP, Hgb/Hct due to continued epigastric pain, intermittent diaphoresis and shakiness    Punam Broussard, PA-C  Please page [facility] on Call if any questions or concerns (#[phone])      Addendum:  EKG reviewed, consistent with sinus bradycardia. Labs reviewed, no electrolyte abnormalities. Discussed with patient, she reports bradycardia at baseline and is feeling better with some residual epigastric pain. IV pepcid ordered. Discussed with patient that she may need to continue to take Tums or Pepcid and avoid triggers for GERD, she and husband are agreeable.      Ella Jubilee, PA-C  Please page [facility] on Call if any questions or concerns [phone])

## 2021-01-10 NOTE — Discharge Instructions (Signed)
Surgical date: 01/09/2021 PR TOTAL HIP ARTHROPLASTY [27130] (ARTHROPLASTY, HIP, TOTAL, ANTERIOR APPROACH)     Helpful Telephone Numbers:  Nurse Navigator: [phone] Monday through Friday 8 am to 4:30 pm (for questions and concerns)  After Hours: [phone] (weekends and M-F after 4:30pm) (for questions and concerns that need to be addressed after office hours)  Appointment line: [phone] make, change, or schedule an appointment)  Surgeons office: Dr. Lucretia Kern - Office Phone [phone]    You or your caregiver should call your surgeon's nurse navigator's line at [phone] for:  . Redness around the incisions   . Continuous drainage or bleeding from incision sites (a small amount of drainage is expected initially)  . Temperature above 101 F  . Increasing swelling, numbness, or pain not controlled by your pain medications   . Any other worrisome condition    You are advised to call Monday through Friday before 4:30pm while the office is open so that your concerns can be best addressed  . Your initial phone call should be placed to the nurse navigator line at [phone]  . For prescription refill requests or if you have not heard back in a timely manner, then call the Surgeon's office at [phone]  . If you feel you are having a medical emergency please present to the [facility] Emergency Department    After hours and on weekends there is always someone on call, available through the answering service [phone] to address your concerns.  . Please don't hesitate to reach out with your questions.   . In the event that you are unable to contact the office or the answering service, please go to [facility] Emergency Department for evaluation for urgent concerns.    CareSense Questionnaire Reminder  Two and four days after surgery, you will receive questionnaires through CareSense. Please complete them both using the app or the link in that day's CareSense email. Your Nurse  Navigators will be checking in with you as they were before surgery, and your responses will help them assess your progress    Thank you for choosing [facility] for your joint replacement.  Please adhere to the following instructions in order to maximize your recovery.    Please follow the instructions next to any check box that has been checked i.e. [x]     Activity:  . You should be Weight bearing as tolerated on your operative extremity. Walk and stair-climb as tolerated.   . Use assistive devices (walker, crutches, cane) as instructed by your Physical Therapist.   . Do not lift, carry, push or pull objects heavier than 10 pounds until cleared by your surgeon.   . Driving is permitted once you are functionally cleared to do so by your surgeon AND you are no longer taking narcotic pain medications.          [x]  You have NO hip precautions.   []  You have posterior hip precautions:     o Do no cross your legs at the knee or ankle.   o Do not bend at the waist past 90 degrees.   o Do not rotate your knee or foot inward.  [x]  Please use an elevated toilet seat for six weeks after surgery.    Diet:  . You may resume your previous diet, as tolerated.   . Please aim for protein intake of approximately 100 grams per day to promote healing. (Meats, fish, peanut butter, and Ensure/Boost shakes are all good sources  of protein.)  . If you feel nauseous or develop vomiting, go back to just clear liquids, increasing to a soft diet and then regular solid foods as tolerated.  . Smoking and/or nicotine use are associated with poor wound healing and increased risk of infection. Therefore it is recommended you not smoke or use nicotine for as long as possible following surgery.  . If you are diabetic, it is important to keep your blood sugars well-controlled. Poorly controlled blood sugars are associated with poor wound healing and increased risk of infection.     Labs:  [x]    None required.  []    Your ***  should be rechecked on ***. Results will be sent to your surgeon's office as well as your PCP.      Preventing Blood Clots (Deep Vein Thrombosis/Pulmonary Embolism or DVT/PE):  Marland Kitchen Please wear your anti-embolism stockings for 14 days. They should be worn during the day and removed at night - unless instructed otherwise.    Marland Kitchen You will be prescribed a blood thinner to help minimize the increased risk of blood clots following surgery. Please refer to your MEDICATION LIST for details. While taking blood thinners, you may find you bruise or develop nose bleeds more easily. You should watch for signs of gastrointestinal bleeding, which may include dark, tar-like stools or vomit with the appearance of coffee-grounds. You should call your surgeon if you notice these signs.  . You have been instructed to take 81mg  of enteric coated aspirin twice daily for 4 weeks  for deep vein thrombosis prophylaxis (blood clot prevention).       Managing your pain:  . Please refer to your MEDICATION LIST for detailed instructions on your personalized pain protocol.  . You should apply ice for 30 minutes every 1-2 hours and elevate your extremity above the level of your heart 3 times a day and during rest to help reduce swelling and pain.  . Narcotic medications are often necessary for a short time after total joint surgery. While you are taking narcotics:  o Do NOT drive or operate heavy machinery.  o Do NOT drink alcohol.  o Do NOT make any important personal, business, or economic decisions or sign legal documents.  o Narcotics can cause side effects such as sedation, lethargy, body-wide itching, and constipation.  o To prevent/alleviate constipation, you should walk as much as you can tolerate and drink plenty of fluids. You should also use a stool softener such as Colace (docusate sodium) and a laxative such as Senokot (senna) unless you develop diarrhea or loose stool.  These two medications have been prescribed to you.  See your  MEDICATION LIST for details.    - If you have not had a bowel movement by your 3rd day after surgery please consider adding Miralax, or taking magnesium citrate to help facilitate a bowel movement. Utilize your pharmacist, or please call your PCP/surgeon's office, if you have any concerns about the addition of these medications to your current home regimen.  . You should work to gradually wean off of narcotic pain medications as your pain improves, first by decreasing the dose, and then by increasing the amount of time in between doses.    . It is important to dispose of all narcotics when you are certain you no longer need them. The first choice for all drug disposal is an authorized medication disposal bin, which is located at the [facility] pharmacy, most retail pharmacies, and law enforcement stations. If you  are unable to access one of these bins, you should flush unused narcotics down the toilet.  . Please call your surgeon's office M-F before 4:30pm for prescription refill requests     Caring for your incision:    PICO system: Your surgical incision is covered with a dressing known as PICO    While the PICO pump is working correctly a green light located at the top of the device will flash continuously.  The dressing should have a slightly wrinkled appearance and feel firm to the touch.    What happens if the PICOT pump alarms?  The PICO pump has a visual alarm for "Low Battery" and "Low Vacuum". These issues are easily solved, for example:    . "Low Battery" - The pump will begin to alert you with a flashing orange light (above the battery symbol) when there are 24 hours and less of battery life. The batteries should be changed at this point. Press the orange button to pause the therapy. Take the battery cover from the top of the pump and replace with two new lithium AA batteries. The way in which the batteries should be positioned is displayed inside the battery compartment. Put the cover back on and  press the orange button again to restart your therapy. The green light and the orange light above the battery will flash together when the batteries need changing.    Marland Kitchen "Low Vacuum" - If there is a low vacuum (low suction) in the dressing, a flashing orange light (above a symbol) will alert you and you will hear the pump make a buzzing sound as it tries to get to the right vacuum. The dressing is still capable of absorbing fluid with a low vacuum but the therapy is not being delivered. The vacuum may be low due to an air leak in the seal around the dressing. Check for any small lifts in the dressing and smooth down around the outside of your dressing including the strips with your hands. Press the orange button to restart your therapy. The green light will flash to indicate that the pump is trying to apply therapy. After about one minute, if the orange low vacuum light starts to flash again, the air leak is not yet resolved. Continue to smooth the dressing and strips to ensure there is no entrance for the air and repress the orange button. After one minute, if the green light continues to flash the air leak has been resolved. Contact your nurse or doctor if you have continuous issues with the flashing low vacuum light. The orange light will flash like this if the pump cannot reach the proper vacuum level.    When will I need a new pump?  The pump is designed to stop working after seven days of continuous use. After this time it will stop and will not restart even with new batteries. All the lights will go off and stay off. At this time the tubing can be clipped, the pump discarded and the dressing should otherwise remain in place until you are seen in your surgeons office (if your 2 week follow up is through telemedicine or zoom this may be removed by visiting nursing service unless otherwise instructed).    Bathing/Showering:   The PICOT pump is splash proof but should not be exposed to jets of water. It may be  disconnected from the dressing when showering is  required by pressing the orange button to pause the therapy; then unscrew the two  parts of the connector. Place the pump somewhere safe  Make sure the tube attached to the dressing is held out of the water and that the end of the tube is pointing downwards so that water cannot enter the tube.  The dressing on top of the wound is water resistant. You can shower or wash with the dressing in place, as long as you take care not to expose it to direct jets of water and not to soak it. Saturating the dressing may cause it to fall off. Do not soak.    . If you have no drainage you may shower with intact dressing in place. If there's any sign that the dressing is peeling at any corner, or wrinkled, cover with plastic wrap before showering. Do not soak your wound in a bath, whirlpool, or in any kind of standing water.  . If your dressing becomes soiled or wet, remove and start daily dressing changes.   . You do not have any surgical staples or sutures that require removal    Follow-up:  . Call your surgeon's office at [phone] to make a follow-up appointment.       Your surgeon requests that you do not receive the Flu or Covid vaccine until 4 weeks have passed since your surgical date    You were prescribed Pepcid for Gastroesophageal Reflux, please follow-up with you primary care physician if you continue to have these symptoms post-operatively.

## 2021-01-10 NOTE — Plan of Care (Signed)
Chart reviewed and social work spoke with patient prior to surgery.  Patient plans on discharge to home with home care services from [facility].  Patient's spouse to assist patient at home.  Patient has all recommended equipment at home for discharge.  No further social work intervention necessary at this time unless initiated by patient and/or medical staff.      HOME CARE AGENCY CHOICE    Medicare regulations require that hospitals advise patients that they may choose to receive services from any home health agency.  Patient/family choice of agency is noted with an X by that agency's name.      Agencies: [x]   [facility] [place], [place], [place], [place])       []   [facility]       []   [facility]      []   [facility]        []   Other ____________________      Home Care Agency Representative alerted:    Name: [place]  Date: 01/03/21  Time:       *[facility] along with [facility], [facility] and [facility] are all part of the [place] [facility] [place]'s health care system.     [facility]  Pager [phone]  01/10/2021  9:01 AM

## 2021-01-10 NOTE — Progress Notes (Signed)
Patient has cleared PT, OT, is voiding spontaneously, and has had x-ray completed.  Patient has achieved adequate pain control and is amenable to discharge.  Discharge instructions reviewed with patient and patient stated understanding.  IV removed.  Patient discharged to home as per order.  Fed Ceci, RN

## 2021-01-10 NOTE — Progress Notes (Signed)
Physical Therapy    Treatment session completed.  The pt has cleared PT to return home upon hospital discharge.    Discharge recommendation:  Intermittent supervision/assist, Anticipate return to prior living arrangement, Home PT  Equipment recommendations upon discharge: None  Mobility recommendations for nursing while in hospital: SBA RW     PT Adult Assessment - 01/10/21 1133        PT Tracking    PT TRACKING PT Assigned     Type of Session follow up/treatment     SW Request? No        Treatment Day    Treatment Day (HH / URR) 2        Precautions/Observations    Precautions used Yes     Total Hip Replacement Surgical Approach: Anterior;Elevated toilet seat;No hip precautions per MD order     Weight Bearing Status Left Lower Extremity - Weight bearing as tolerated     LDA Observation None     Was patient wearing a mask? Yes     PPE worn by Leisure centre manager;Face Shield;Mask     Fall Precautions General falls precautions;Patient educated to use call light for nursing assist prior to getting up;White board updated with appropriate mobility status        Pain Assessment    *Is the patient currently in pain? Yes     Pain (Before,During, After) Therapy During     0-10 Scale 3     Pain Location Hip     Pain Orientation Left;Anterior     Pain Descriptors Aching     Pain Intervention(s) Cold applied;Repositioned;Refer to nursing for pain management        Bed Mobility    Supine to Sit Modified independent (device)     Sit to Supine Modified independent (device)        Transfers    Sit to Stand Modified independent (device)     Stand to sit Modified independent (device)     Transfer Assistive Device rolling walker     Additional comments No cues required.        Mobility    Weight Bearing Status LLE Left Lower Extremity - Weight bearing as tolerated     Gait Pattern 2 point;Decreased cadence;Decreased R step length;Decreased R step height;Decreased L step length;Decreased L step height     Ambulation Assist Modified  independent (device)     Ambulation Distance (Feet) 120     Ambulation Assistive Device rolling walker     Platform Step Modified independent (device)   x 2 reps    Additional comments The pt states a RW will fit onto the steps to enter the home.        Family/Caregiver Training`    Patient/Family/Caregiver training Yes     Patient training Role of physical therapy in hospital and plan for evaluation and follow up;Discharge planning;PT plan of care after evaluation;Post-operative precautions;Purpose and importance of icing and recommendations for ice frequency and duration;Use of assistive device;Equipment recommendations for discharge;Therapeutic exercises, including recommendation of frequency of therapeutic exercises to be completed by patient throughout day;Benefits of oob activity and mobility during hospitalization, including frequency of ambulation and change of position;Call don't fall, purpose of bed/chair alarm, and recommendation for nursing assistance during all oob mobility        Therapeutic Exercises    Heel Slides, Supine Left     Heel Slides, Supine-Assist Active assisted     Heel Slides, Supine-Reps 10     Hip Flexion,  Standing Left     Hip Flexion, Standing-Assist Active     Hip Flexion, Standing-Reps 10     Sit to stand 10     Standing heel/toe raises 10     Standing terminal knee extension 10        Balance    Sitting - Static Independent      Sitting - Dynamic Independent     Standing - Static Independent;Supported     Standing - Dynamic Independent;Supported        PT AM-PAC Mobility    Turning over in bed? 3     Sitting down on and standing up from a chair with arms? 4     Moving from lying on back to sitting on the side of the bed? 4     Moving to and from a bed to a chair? 4     Need to walk in hospital room? 4     Climbing 3 - 5 steps with a railing? 4     Total Raw Score 23     Standardized Score - Calculated 56.93     % Functional Impairment - Calculated 11%        Gait Speed     Assistive Device rolling walker     Distance Walked (meters) 3.05 m     Time it took to walk distance (seconds) 10.29 sec     Gait speed (m/sec) 0.3 m/sec        Assessment    Brief Assessment Appropriate for skilled therapy;Patient demonstrates adequate mobility skills to return home     Patient / Family Goal home today        Plan/Recommendation    PT Treatment Interventions Restorative PT;AROM;Bed mobility training;Transfers training;Stair training;Pt/Family education;Strengthening;Gait training;Home exercise program instruction;D/C planning;Will work to minimize pain while promoting mobility whenever possible     PT Frequency 5-7x/wk     PT Mobility Recommendations SBA RW     PT Referral Recommendations OT;SW;Home care     PT Discharge Recommendations Intermittent supervision/assist;Anticipate return to prior living arrangement;Home PT     PT Discharge Equipment Recommended None     PT Assessment/Recommendations Reviewed With: Family;Nursing;Patient     Next PT Visit Pt has cleared PT to return home     PT needs to see patient prior to DC  No        Time Calculation    PT Timed Codes 29     PT Untimed Codes 0     PT Unbilled Time 0     PT Total Treatment 29        Plan and Onset date    Plan of Care Date 01/09/21     Onset Date 01/09/21     Treatment Start Date 01/09/21               AMPAC Score Interpretation:  Adapted from Roxanne Gates, et al Association of AM-PAC 6 Clicks Basic Mobility and Daily Activity Scores with discharge destination, 2021  AM-PAC Raw Score of > 16 (standardized score > 40.78, % impairment <  54%) indicates a high likelihood of discharge to home     Adapted from Loma Sender al Prediction of disposition within 48-hours of hospital admission using patient mobility scores, 2019  AM-PAC raw score  ? 15 (standardized score ? 39, % impairment  < 58%) indicates a high probability of a discharge to home  AM-PAC raw score 9-14 (standardized score ? 31, % impairment 81%-61%) and  age < 38 is more  likely to discharge to home  AM-PAC raw score 9-14 (standardized score ? 31, % impairment 81%-61%) and age > 46 is more likely to require post-acute care  AM-PAC raw score < 9 (standardized score < 31, % impairment > 81%) indicates pt is more likely to require post-acute care  Gait Speed Score Interpretation:  Leodis Liverpool, Kennyth Arnold PT, PhD; Rexanne Mano PT, PhD. Walking Speed: the Sixth Vital Sign." Journal of Geriatric Physical Therapy 32. 2 (2009) : 2-9.)   0 m/s -0.4 m/s: Household walker; Needs intervention to reduce fall risk; Dependent with ADL's and IADL's   0.4 m/s -0.6 m/s: [employer] information centre manager; Needs intervention to reduce fall risk   0.6 m/s -1.0 m/s: [employer]; Needs intervention to reduce fall risk   m/s- 1.4 m/s: Normal Walking speed, Cross the [employer], Less likely to have adverse events.       Duncan Dull, MSPT  Web Page or Secure Chat Duncan Dull

## 2021-01-10 NOTE — Progress Notes (Signed)
[place]  Department of Orthopaedic Surgery         Orthopaedic Surgery Progress Note    Subjective:  S/P L THA. No acute events since surgery. Pain controlled. Voiding. Denies fever/chills, shortness of breath/chest pain, nausea/vomiting.    Objective:    Vital Signs  Current Vitals Vitals Range (24 hours)   BP 105/57 (BP Location: Right arm)    Pulse 58    Temp 36.2 C (97.2 F) (Temporal)    Resp 18    Ht 1.588 m (5' 2.5")    Wt 82.1 kg (181 lb)    SpO2 97%    BMI 32.58 kg/m  BP: (102-136)/(57-93)   Temp:  [36 C (96.8 F)-36.6 C (97.9 F)]   Temp src: Temporal (03/21 2308)  Heart Rate:  [58-68]   Resp:  [16-19]   SpO2:  [94 %-98 %]   Height:  [158.8 cm (5' 2.5")]   Weight:  [82.1 kg (181 lb)]      Intake/Output:  Date 01/09/21 0700 - 01/10/21 0659 01/10/21 0700 - 01/11/21 0659   Shift 0700-1459 1500-2259 2300-0659 24 Hour Total 0700-1459 1500-2259 2300-0659 24 Hour Total   INTAKE   P.O. 0 2100  2100         P.O. 0 2100  2100       I.V.(mL/kg/hr) 1300(2)   1300         Volume (mL) (Lactated Ringers Infusion) 1300   1300       IV Piggyback 20   20         Volume (mL) (ceFAZolin (ANCEF) 2,000 mg in sterile water (PF) IV syringe 20 mL) 20   20       Shift Total(mL/kg) 1320(16.1) 2100(25.6)  3420(41.7)       OUTPUT   Urine(mL/kg/hr) 0(0) 1450(2.2) 600 2050         Urine 0 1450 600 2050         Pre-Void Bladder Scan Reading 305 ml   305 ml         Post-Void Bladder Scan Reading  57 ml  57 ml       Emesis/NG output 0   0         Emesis 0   0       Stool 0   0         Stool (mL) 0   0       Blood 250   250         Blood Loss 250   250       Shift Total(mL/kg) 250(3) 1450(17.7) 600(7.3) 2300(28)       NET 1070 650 -600 1120       Weight (kg) 82.1 82.1 82.1 82.1 82.1 82.1 82.1 82.1       Physical Exam:  General: Alert, no acute distress, supine in bed.  Cardiovascular: Warm and well-perfused  Respiratory: Comfortable on room air    LLE:   Dressing clean, dry, intact.   Motor intact hip and knee  flexion/extension, ankle dorsiflexion/plantarflexion, toe flexion/extension.   Sensation intact to light touch 1st dorsal web space/medial/lateral/plantar/dorsal foot.  2+ dorsalis pedis/posterior tibialis pulse, toes warm and well-perfused, < 3 second capillary refill.    Medications  Scheduled:    psyllium  1 packet Oral Daily    docusate sodium  200 mg Oral Daily    senna  2 tablet Oral Nightly    polyethylene glycol  17 g Oral Daily  acetaminophen  1,000 mg Oral 3 times per day    morphine  15 mg Oral 2 times per day    meloxicam  15 mg Oral Daily    aspirin EC  81 mg Oral BID    lubiprostone  24 mcg Oral BID WC       Continuous Infusions:    sodium chloride Stopped (01/09/21 2052)       PRN: sodium chloride, dextrose, sodium chloride, dextrose, bisacodyl, traZODone, ondansetron, oxyCODONE **OR** oxyCODONE, camphor-menthol, calcium carbonate    Laboratory studies:  Recent Labs   Lab 01/09/21  1744   Hemoglobin 12.8   Hematocrit 39     No results for input(s): INR, PTT, PTI in the last 168 hours.   Recent Labs   Lab 01/09/21  1744   Sodium 141   Potassium 4.7   Chloride 104   CO2 22   UN 13   Creatinine 0.74   Glucose 185*   Calcium 8.6         No results for input(s): WBC, ESR, CRP in the last 168 hours.    Imaging:  PACU XR shows appropriate alignment of implant     Assessment/Plan: Lynn Wagner is a 65 y.o. female now 1 Day Post-Op  s/p L THA.           Pain control: Multimodal   Diet: Regular    Abx: Cefazolin x 2 doses    Home meds: Reviewed    Xrays reviewed   Weight-bearing status: WBAT LLE, ice/elevate   PT/OT & OOB   Bowel regimen onboard   DVT ppx: ASA 81 mg BID x 4 weeks   Dispo: Pending PT and pain control      Buddy Duty, MD  Orthopaedic Surgery Resident  01/10/2021 at 6:25 AM

## 2021-01-10 NOTE — Progress Notes (Signed)
Occupational Therapy      OT Assessment ([facility] / [facility]) - 01/10/21 0843        OT Tracking  ([facility] / [facility])    OT Tracking ([facility] / [facility]) OT Eval Attempted        Additional Comments    Additional Comments Chart review completed and OT order acknowledged. Upon entering room RN and PCT present with pt - pt reporting lightheadedness politely requesting writer to attempt again later. Will re-attempt when pt more appropriate to participate.              Andrey Farmer, OTR/L  Secure chat to Andrey Farmer or web page to [phone]

## 2021-01-12 NOTE — Progress Notes (Signed)
POD #2   Have you reviewed your discharge instructions?  Yes     Are you tolerating food and drinks?  Yes     Have you had a bowel movement since your surgery?  No updated message to add meds to bowel regime    Is your pain being tolerated well with pain medication?  Yes     Are you wearing your white stockings/TEDs/compression stockings?  Yes     Is there drainage from your incision that's covering more than 50 percent of your dressing?  No     Are you taking your blood thinners? Examples: aspirin, Lovenox, Coumadin, Xarelto, and Eliquis.  No clarified that she is taking her bid baby asa    Are you walking every hour while awake?  Yes

## 2021-01-16 LAB — SURGICAL PATHOLOGY

## 2021-01-24 ENCOUNTER — Ambulatory Visit: Payer: No Typology Code available for payment source | Admitting: Orthopedic Surgery

## 2021-01-24 ENCOUNTER — Encounter: Payer: Self-pay | Admitting: Orthopedic Surgery

## 2021-01-24 VITALS — BP 128/76 | Ht 62.5 in | Wt 180.0 lb

## 2021-01-24 DIAGNOSIS — Z96642 Presence of left artificial hip joint: Secondary | ICD-10-CM

## 2021-01-24 MED ORDER — MELOXICAM 15 MG PO TABS *I*
15.0000 mg | ORAL_TABLET | Freq: Every day | ORAL | 0 refills | Status: AC
Start: 2021-01-24 — End: 2021-02-07

## 2021-01-24 NOTE — Progress Notes (Signed)
ADULT POST OP THR    Lynn Wagner presents today for a post-op visit following a left total hip arthroplasty performed 01/09/2021. Currently patient is doing rehab at home.  Patient states pain is2 out of 10.  The patient is ambulating with a cane.  The surgical wound has been dry. Range of motion has been progressing well.  No significant hip pain improvement from the preoperative baseline.  Remains compliant with aspirin twice daily for DVT prophylaxis.  Patient denies any interval fever or chill history.     Exam: Swelling is mild.  The surgical wound is healing well. There is no redness. There is no drainage. There is no induration.  Range of motion is hip flexion 90 degrees, internal and external rotation 10 degrees, abduction 20 degrees.  Pain with range of motion is minimal.  The calf is nontender. Sensation is intact. Alignment of the limb is normal.     Radiographs were obtained postoperatively in the hospital and personally reviewed. These show a total hip arthroplasty with normal alignment.    Impression: Left THR-01/09/2021.     Plan: The patient's x-ray findings and underlying diagnosis reviewed.  Patient was congratulated regarding ongoing postoperative improvement.  She will continue with her HEP as already instructed.  Importance of following dental antibiotic prophylaxis precautions reviewed.  Patient will complete 28-day course of aspirin twice daily for DVT prophylaxis.  Acceptable low-impact exercise activities were once again reviewed.  All ambulatory activities to be performed within the confines of comfort.  Additional questions invited and answered.     Follow -up in 4 weeks.     Orders Next Visit: 2 views left hip x-ray.  Answers for HPI/ROS submitted by the patient on 01/19/2021  What is your goal for today's visit?: Follow up and instructions  Was this the result of an injury?: No  Fever: No  Numbness: No  Tingling: No

## 2021-02-21 ENCOUNTER — Ambulatory Visit: Payer: No Typology Code available for payment source | Admitting: Orthopedic Surgery

## 2021-02-21 ENCOUNTER — Ambulatory Visit
Admission: RE | Admit: 2021-02-21 | Discharge: 2021-02-21 | Disposition: A | Discharge status: Home / Self Care | Payer: No Typology Code available for payment source | Source: Ambulatory Visit | Attending: Family | Admitting: Family

## 2021-02-21 ENCOUNTER — Encounter: Payer: Self-pay | Admitting: Orthopedic Surgery

## 2021-02-21 VITALS — BP 128/76 | Ht 62.5 in | Wt 180.0 lb

## 2021-02-21 DIAGNOSIS — Z471 Aftercare following joint replacement surgery: Secondary | ICD-10-CM | POA: Insufficient documentation

## 2021-02-21 DIAGNOSIS — Z96642 Presence of left artificial hip joint: Secondary | ICD-10-CM

## 2021-02-21 NOTE — Progress Notes (Signed)
Subjective: Lynn Wagner is here 6 weeks s/p left THA. Overall the patient is doing well, minor complaints of occasional (activity related groin pain) and intoeing.     ROS: No chest pain, SOB, fever, chills, skin erythema, all remaining systems negative.    Objective:  Constitutional: NAD, comfortable  Psychiatric: Normal affect  Musculoskeletal:  Soft tissue: Well healed surgical incision, no erythema, no drainage  Gait: unassisted  Neurocirculatory: compartments soft, foot warm and perfused, ankle plantar and dorsiflexion intact   Left Hip: no pain with circumduction    Radiographs: AP pelvis lateral left hip reviewed demonstrating satisfactory and stable component position. Limb length & offset appropriate.      Impression:  1. S/P Left THA - 6 weeks    Plan:  Eleln is doing well overall, personal concerns addressed, reassured minor complaints consistent with course of healing. Reviewed radiographs with patient and spouse. Follow up 1 year.     Answers for HPI/ROS submitted by the patient on 02/20/2021  What is your goal for today's visit?: Follow up  Date of onset: : 06/05/2020  Was this the result of an injury?: No  What is your pain level?: 1/10  Please describe the quality of your pain: : dull ache, tenderness  What diagnostic workup have you had for this condition?: X-ray  What treatments have you tried for this condition?: acetaminophen, ice  Progression since onset: : rapidly improving  Is this a work related condition? : No  Fever: No  Chills: No  Numbness: No  Tingling: No

## 2021-06-28 ENCOUNTER — Encounter: Payer: Self-pay | Admitting: Orthopedic Surgery

## 2021-06-28 ENCOUNTER — Ambulatory Visit
Admission: RE | Admit: 2021-06-28 | Discharge: 2021-06-28 | Disposition: A | Discharge status: Home / Self Care | Payer: Medicare (Managed Care) | Source: Ambulatory Visit

## 2021-06-28 ENCOUNTER — Ambulatory Visit: Payer: Medicare (Managed Care) | Attending: Orthopedic Surgery | Admitting: Orthopedic Surgery

## 2021-06-28 VITALS — BP 163/75 | HR 45 | Ht 62.5 in | Wt 180.0 lb

## 2021-06-28 DIAGNOSIS — Z96642 Presence of left artificial hip joint: Secondary | ICD-10-CM

## 2021-06-28 DIAGNOSIS — M85852 Other specified disorders of bone density and structure, left thigh: Secondary | ICD-10-CM

## 2021-06-30 NOTE — Progress Notes (Signed)
Subjective: Lynn Wagner is here 5-6 month s/p left THA. Patient now reporting thigh pain with activity    ROS: No chest pain, SOB, fever, chills, skin erythema, all remaining systems negative.    Objective:  Constitutional: NAD, comfortable  Psychiatric: Normal affect  Musculoskeletal:  Soft tissue: Well healed surgical incision, no erythema, no drainage  Gait: unassisted  Neurocirculatory: compartments soft, foot warm and perfused, ankle plantar and dorsiflexion intact   Left Hip: no pain with circumduction, thigh tender to palpation    Radiographs: AP pelvis lateral left hip reviewed demonstrating satisfactory and stable component position. Limb length & offset appropriate, calcar atrophy noted.      Impression:  1. S/P Left THA -with presumed stem pain    Plan:  Reassured radiographs well appearing, discussed etiology of stem pain (elastic modulus mismatch), discussed activity as tolerated and self limiting nature of stem complaints, follow up 6 months.

## 2021-08-06 ENCOUNTER — Other Ambulatory Visit: Payer: Self-pay

## 2021-08-06 ENCOUNTER — Ambulatory Visit: Payer: Medicare (Managed Care) | Attending: Family Medicine | Admitting: Family Medicine

## 2021-08-06 ENCOUNTER — Encounter: Payer: Self-pay | Admitting: Family Medicine

## 2021-08-06 VITALS — BP 160/70 | HR 61 | Temp 98.0°F | Resp 17 | Wt 170.0 lb

## 2021-08-06 DIAGNOSIS — Z87891 Personal history of nicotine dependence: Secondary | ICD-10-CM | POA: Insufficient documentation

## 2021-08-06 DIAGNOSIS — J00 Acute nasopharyngitis [common cold]: Secondary | ICD-10-CM | POA: Insufficient documentation

## 2021-08-06 NOTE — [facility] Provider Note (Signed)
History     Chief Complaint   Patient presents with    Sore Throat     Starteed last evening, this morning jaw started to ache.       65 y/o female with c/o sore throat and jaw ache since last evening.          Medical/Surgical/Family History     Past Medical History:   Diagnosis Date    Primary osteoarthritis of left hip 12/22/2020        Patient Active Problem List   Diagnosis Code    Primary osteoarthritis of left hip M16.12    S/P L aTHA 01/09/21 Z96.649            Past Surgical History:   Procedure Laterality Date    BREAST SURGERY      lumpectomy    HYSTERECTOMY      JOINT REPLACEMENT Bilateral     knees    OTHER SURGICAL HISTORY      thumb tendo repair    PR TOTAL HIP ARTHROPLASTY Left 01/09/2021    Procedure: LEFT ARTHROPLASTY, HIP, TOTAL ANTERIOR APPROACH;  Surgeon: Laurance Flatten, MD;  Location: [facility];  Service: Orthopedics    ROTATOR CUFF REPAIR Left      History reviewed. No pertinent family history.       Social History     Tobacco Use    Smoking status: Former Smoker     Types: Cigarettes     Quit date: 12/22/2017     Years since quitting: 3.6    Smokeless tobacco: Never Used   Substance Use Topics    Alcohol use: Yes     Alcohol/week: 2.0 standard drinks     Types: 2 Cans of beer per week     Comment: 2 beers 3x per week    Drug use: Not on file     Living Situation     Questions Responses    Patient lives with     Homeless     Caregiver for other family member     External Services     Employment     Domestic Violence Risk                 Review of Systems   Review of Systems   HENT: Positive for ear pain (left side), postnasal drip, rhinorrhea, sinus pressure, sinus pain (minor) and sore throat.    Respiratory: Positive for cough.    Musculoskeletal: Negative for myalgias.   Neurological: Negative for headaches.       Physical Exam   Vitals      First Recorded BP: 160/70, Resp: 17, Temp: 36.7 C (98 F) Oxygen Therapy SpO2: 98 %, Heart Rate: 61, (08/06/21 0945)  .      Physical  Exam  Vitals and nursing note reviewed.   Constitutional:       General: She is not in acute distress.     Appearance: Normal appearance. She is obese. She is not ill-appearing.   HENT:      Head: Normocephalic and atraumatic.      Right Ear: Tympanic membrane, ear canal and external ear normal.      Left Ear: Tympanic membrane, ear canal and external ear normal.      Nose: Nose normal.      Mouth/Throat:      Mouth: Mucous membranes are moist.      Pharynx: Oropharynx is clear. Posterior oropharyngeal erythema (mild erythematous with PND  streaking) present. No oropharyngeal exudate.   Eyes:      Conjunctiva/sclera: Conjunctivae normal.   Cardiovascular:      Rate and Rhythm: Normal rate and regular rhythm.      Heart sounds: Normal heart sounds.   Pulmonary:      Effort: Pulmonary effort is normal. No respiratory distress.      Breath sounds: Normal breath sounds.   Musculoskeletal:         General: Normal range of motion.      Cervical back: Normal range of motion and neck supple. No tenderness.   Lymphadenopathy:      Cervical: No cervical adenopathy.   Skin:     General: Skin is warm and dry.      Findings: No rash.   Neurological:      Mental Status: She is alert.      Gait: Gait normal.   Psychiatric:         Mood and Affect: Mood normal.         Behavior: Behavior normal.         Thought Content: Thought content normal.         Judgment: Judgment normal.          Medical Decision Making   Medical Decision Making  Assessment:    Nasopharyngitis  Viral URI    Plan and Results:    1.  Rapid strep test in clinic is negative.  2.  Throat culture and sensitivity.  Patient is aware results will take 48-72 hours.  Patient may check on MyChart for results.  We will call with any positives or if a change in therapy is necessary.  3.  Hot salt water gargles and throat lozenges as needed  4.  OTC Tylenol or ibuprofen as directed for pain or fever.  5.  Follow-up with PCP in 7-10 days if still symptomatic.  6.  Discussed  all the above and answered all questions.  Patient/parent/guardian verbalizes understanding and agrees with treatment plan.          Final Diagnosis    ICD-10-CM ICD-9-CM   1. Nasopharyngitis  [id number] [id number]         Tam Savoia SCOTT Amirra Herling, PA

## 2021-08-06 NOTE — Patient Instructions (Signed)
1.  Rapid strep test in clinic is negative.  2.  Throat culture and sensitivity.  Patient is aware results will take 48-72 hours.  Patient may check on MyChart for results.  We will call with any positives or if a change in therapy is necessary.  3.  Hot salt water gargles and throat lozenges as needed  4.  OTC Tylenol or ibuprofen as directed for pain or fever.  5.  Follow-up with PCP in 7-10 days if still symptomatic.  6.  Discussed all the above and answered all questions.  Patient/parent/guardian verbalizes understanding and agrees with treatment plan.

## 2021-08-06 NOTE — Addendum Note (Signed)
Addended by: Daiva Eves on: 08/06/2021 10:18 AM     Modules accepted: Orders

## 2021-08-07 LAB — STREP A CULTURE, THROAT: Group A Strep Throat Culture: 0

## 2022-01-04 ENCOUNTER — Encounter: Payer: Self-pay | Admitting: Orthopedic Surgery

## 2022-01-23 ENCOUNTER — Other Ambulatory Visit: Payer: Self-pay

## 2022-01-23 ENCOUNTER — Ambulatory Visit
Admission: RE | Admit: 2022-01-23 | Discharge: 2022-01-23 | Disposition: A | Discharge status: Home / Self Care | Payer: Medicare (Managed Care) | Source: Ambulatory Visit | Attending: Orthopedic Surgery | Admitting: Orthopedic Surgery

## 2022-01-23 ENCOUNTER — Encounter: Payer: Self-pay | Admitting: Orthopedic Surgery

## 2022-01-23 ENCOUNTER — Ambulatory Visit: Payer: Medicare (Managed Care) | Admitting: Orthopedic Surgery

## 2022-01-23 VITALS — BP 149/70 | Ht 64.0 in | Wt 180.0 lb

## 2022-01-23 DIAGNOSIS — M25552 Pain in left hip: Secondary | ICD-10-CM | POA: Insufficient documentation

## 2022-01-23 DIAGNOSIS — Z96642 Presence of left artificial hip joint: Secondary | ICD-10-CM

## 2022-01-23 DIAGNOSIS — Z96649 Presence of unspecified artificial hip joint: Secondary | ICD-10-CM

## 2022-01-23 NOTE — Progress Notes (Addendum)
ADULT POST-OP THA    HPI: Lynn Wagner presents today for a 1 year(s) post-op visit following a left total hip arthroplasty performed on 01/09/21. She was seen by Dr. Ardine Eng 06/28/21  and he  discussed etiology of stem pain (elastic modulus mismatch).  Patient states pain is 8/10 in severity. Pain is located in left anterior  thigh and left lateral  waist area. She reports episodes of severe pain that occur intermittently during day. Episodes last for 15 seconds. She was able to ski 40 days this year. After her season need her pain became worse.She made appointment today because she felt something was wrong. She has a follow up with Dr. Ardine Eng 02/06/22.     EXAM: Well-developed, well-nourished, appropriately dressed, healthy appearing 66 y.o.-year-old female in no apparent distress. Mood and affect appropriate. A+O x3. Pt seated comfortably in chair. Able to rise independently from seated position.     Focused musculoskeletal exam was performed today and demonstrates:   No antalgic gait without an observable limp    No reproducible left hip pain with active assisted range of motion   Hip Flexion:  mildly irritable with resistance   Skin intact mild discomfort on left lateral thigh.    No significant periarticular soft tissue tenderness to palpation   Calf is soft and nontender, distal extremity CMS intact   5/5 DF/PF, ITLT tibial and peroneal nerve, 2+ DP/PT pulse    XRAYS:   01/23/2022 10:01 AM     LEFT HIP X-RAY   COMPARISON: 06/28/2021   PROCEDURE: A single projection of the left hip was obtained.     FINDINGS: There is left total hip arthroplasty in approximate anatomic alignment. The hardware is intact. There is no evidence of acute fracture or dislocation.     Impression     Stable appearance of the left hip total arthroplasty.     END OF IMPRESSION         ASSESSMENT: s/p left THA with ongoing symptoms that are consistent with stem pain (elastic modulus mismatch),  Left  thigh  symptoms may have been  exacerbated by recent increased activity.Left lateral  Thigh symptoms could be soft tissue and or referred from back. Previous films show severe lumbosacral narrowing. The patient's x-ray findings are reviewed are reviewed. I reassured her there is no evidence of fracture or stem subsidence.She will discuss activity level at next follow up. I did not advise protects weight bearing. I did discuss use of cane but felt patient would like to review all symptoms and treatment with Dr. Ardine Eng.     PLAN:  .    Follow up visit at 2 week(s) postop or sooner when necessary for reexamination of left hip.    Orders Next Visit: None, unless clinically warranted    Jacksen Isip A Marguis Mathieson, NP  Division of Adult Reconstruction  Department of Orthopaedic Surgery  [facility] [place]    Answers for HPI/ROS submitted by the patient on 01/18/2022  Date of onset: : 12/11/2021  Was this the result of an injury?: No  What is your pain level?: 7/10  Please describe the quality of your pain: : aching, discomfort, sharp, shooting, tenderness  What diagnostic workup have you had for this condition?: X-ray  What treatments have you tried for this condition?: acetaminophen, NSAIDs  Progression since onset: : waxing and waning  Is this a work related condition? : No  Fever: No  Chills: No  Numbness: No  Tingling: No

## 2022-01-23 NOTE — Patient Instructions (Signed)
VIEW YOUR HEALTH INFORMATION ONLINE TODAY!    Below is your activation code to sign up for MyChart, [facility]’s free website that allows you to view most test results, send and receive messages from your doctor, renew your prescriptions, request an appointment, and much more!    How Do I Sign Up?    Important:  You must have an email address to sign up for MyChart.    1. Go to [email or web address]  2. Click on the Sign Up (I have a code) link under "New User?" located in the blue box on the left hand side of the screen.    3. Enter your MyChart Activation Code: Activation code not generated  4. Current [facility] MyChart Status: Active  5. Enter your Date of Birth, Zip Code and Phone Number, and click NEXT.  6. Continue following the instructions to complete your MyChart sign up.    • You also may want to write down your Username and Password below:    MY USERNAME:  _______________________________________________    MY PASSWORD: _______________________________________________    Additional Information    If you are not the patient, but you are involved in the health care of this patient, DO NOT USE THIS CODE!  Instead, go to the MyChart website [email or web address]) and click on “Access for My Kids/Family/Friends” below the username and password fields.    Questions?    Call our MyChart Customer Service Center, 8 a.m. to 5 p.m.   Weekdays: [phone]; choose Option 1

## 2022-02-06 ENCOUNTER — Ambulatory Visit: Payer: Medicare (Managed Care) | Admitting: Orthopedic Surgery

## 2022-02-16 ENCOUNTER — Encounter: Payer: Self-pay | Admitting: Orthopedic Surgery

## 2022-02-16 ENCOUNTER — Ambulatory Visit: Payer: Medicare (Managed Care) | Admitting: Orthopedic Surgery

## 2022-02-16 ENCOUNTER — Other Ambulatory Visit: Payer: Self-pay

## 2022-02-16 VITALS — BP 136/78 | Ht 64.0 in | Wt 180.0 lb

## 2022-02-16 DIAGNOSIS — Z96642 Presence of left artificial hip joint: Secondary | ICD-10-CM

## 2022-02-20 NOTE — Progress Notes (Signed)
Subjective: Lynn Wagner is here 12 month s/p left THA. Patient reports improved thigh pain, now with activity based groin pain.     ROS: No chest pain, SOB, fever, chills, skin erythema, all remaining systems negative.    Objective:  Constitutional: NAD, comfortable  Psychiatric: Normal affect  Musculoskeletal:  Soft tissue: Well healed surgical incision, no erythema, no drainage  Gait: unassisted  Neurocirculatory: compartments soft, foot warm and perfused, ankle plantar and dorsiflexion intact   Left Hip: no pain with circumduction, thigh tender to palpation    Radiographs: AP pelvis lateral left hip reviewed demonstrating satisfactory and stable component position. Limb length & offset appropriate, calcar atrophy noted.      Impression:  1. S/P Left THA -with resolved stem pain    Plan:  Doing well, discussed soft tissue etiology of pain, expect eventual improvement. Follow up 1 year.

## 2022-08-14 ENCOUNTER — Ambulatory Visit: Payer: Medicare (Managed Care) | Admitting: Orthopedic Surgery

## 2022-08-14 ENCOUNTER — Other Ambulatory Visit: Payer: Self-pay

## 2022-08-14 VITALS — BP 142/72 | HR 50 | Wt 180.0 lb

## 2022-08-14 DIAGNOSIS — Z96642 Presence of left artificial hip joint: Secondary | ICD-10-CM

## 2022-08-14 NOTE — Progress Notes (Signed)
This is a confidential patient report written  for the express purpose of professional communication.    Subjective: Lynn Wagner s/p THA groin pain improved from prior, remains present at times with activity    ROS: No chest pain, SOB, fever, chills, skin erythema, all remaining systems negative.    Objective:  Constitutional: NAD, comfortable  Psychiatric: Normal affect  Musculoskeletal:  Ambulating unassisted    Radiographs: None    Impression:  1.  Left THA, residual soft tissue pain.     Plan:  Symptoms improved, will continue surveillance follow up 6 months (2 years), discussed maximal recovery 2 years.

## 2022-09-21 ENCOUNTER — Other Ambulatory Visit
Admission: RE | Admit: 2022-09-21 | Discharge: 2022-09-21 | Disposition: A | Discharge status: Home / Self Care | Payer: Medicare (Managed Care) | Source: Ambulatory Visit | Attending: Internal Medicine | Admitting: Internal Medicine

## 2022-09-21 DIAGNOSIS — M542 Cervicalgia: Secondary | ICD-10-CM | POA: Insufficient documentation

## 2022-09-21 DIAGNOSIS — Z0000 Encounter for general adult medical examination without abnormal findings: Secondary | ICD-10-CM | POA: Insufficient documentation

## 2022-09-21 DIAGNOSIS — E663 Overweight: Secondary | ICD-10-CM | POA: Insufficient documentation

## 2022-09-21 LAB — LIPID PANEL
Chol/HDL Ratio: 3.1
Cholesterol: 221 mg/dL — AB
HDL: 72 mg/dL — ABNORMAL HIGH (ref 40–60)
LDL Calculated: 129 mg/dL
Non HDL Cholesterol: 149 mg/dL
Triglycerides: 98 mg/dL

## 2022-09-21 LAB — COMPREHENSIVE METABOLIC PANEL
ALT: 15 U/L (ref 0–35)
AST: 17 U/L (ref 0–35)
Albumin: 4.4 g/dL (ref 3.5–5.2)
Alk Phos: 78 U/L (ref 35–105)
Anion Gap: 11 (ref 7–16)
Bilirubin,Total: 0.4 mg/dL (ref 0.0–1.2)
CO2: 26 mmol/L (ref 20–28)
Calcium: 9.2 mg/dL (ref 8.6–10.2)
Chloride: 105 mmol/L (ref 96–108)
Creatinine: 0.82 mg/dL (ref 0.51–0.95)
Glucose: 90 mg/dL (ref 60–99)
Lab: 14 mg/dL (ref 6–20)
Potassium: 4.5 mmol/L (ref 3.3–5.1)
Sodium: 142 mmol/L (ref 133–145)
Total Protein: 6.2 g/dL — ABNORMAL LOW (ref 6.3–7.7)
eGFR BY CREAT: 79 *

## 2022-09-21 LAB — CBC AND DIFFERENTIAL
Baso # K/uL: 0 10*3/uL (ref 0.0–0.2)
Basophil %: 0.4 %
Eos # K/uL: 0.1 10*3/uL (ref 0.0–0.5)
Eosinophil %: 2.5 %
Hematocrit: 41 % (ref 34–49)
Hemoglobin: 13.6 g/dL (ref 11.2–16.0)
IMM Granulocytes #: 0 10*3/uL (ref 0.0–0.0)
IMM Granulocytes: 0.2 %
Lymph # K/uL: 1.9 10*3/uL (ref 1.0–5.0)
Lymphocyte %: 40.2 %
MCH: 31 pg (ref 26–32)
MCHC: 34 g/dL (ref 32–36)
MCV: 93 fL (ref 75–100)
Mono # K/uL: 0.4 10*3/uL (ref 0.1–1.0)
Monocyte %: 9.2 %
Neut # K/uL: 2.3 10*3/uL (ref 1.5–6.5)
Nucl RBC # K/uL: 0 10*3/uL (ref 0.0–0.0)
Nucl RBC %: 0 /100 WBC (ref 0.0–0.2)
Platelets: 271 10*3/uL (ref 150–450)
RBC: 4.4 MIL/uL (ref 4.0–5.5)
RDW: 13.3 % (ref 0.0–15.0)
Seg Neut %: 47.5 %
WBC: 4.8 10*3/uL (ref 3.5–11.0)

## 2022-09-21 LAB — HEMOGLOBIN A1C: Hemoglobin A1C: 5.4 %

## 2022-09-21 LAB — MICROALBUMIN, URINE, RANDOM
Creatinine,UR: 47 mg/dL (ref 20–300)
Microalbumin,UR: <1.2 mg/dL

## 2023-01-14 ENCOUNTER — Other Ambulatory Visit: Payer: Self-pay | Admitting: Internal Medicine

## 2023-01-14 DIAGNOSIS — Z139 Encounter for screening, unspecified: Secondary | ICD-10-CM

## 2023-01-16 ENCOUNTER — Other Ambulatory Visit: Payer: Self-pay

## 2023-01-16 ENCOUNTER — Other Ambulatory Visit: Payer: Self-pay | Admitting: Internal Medicine

## 2023-01-16 ENCOUNTER — Ambulatory Visit
Admission: RE | Admit: 2023-01-16 | Discharge: 2023-01-16 | Disposition: A | Discharge status: Home / Self Care | Payer: Medicare (Managed Care) | Source: Ambulatory Visit | Attending: Internal Medicine | Admitting: Internal Medicine

## 2023-01-16 DIAGNOSIS — Z1231 Encounter for screening mammogram for malignant neoplasm of breast: Secondary | ICD-10-CM | POA: Insufficient documentation

## 2023-01-16 DIAGNOSIS — Z139 Encounter for screening, unspecified: Secondary | ICD-10-CM

## 2023-02-07 ENCOUNTER — Other Ambulatory Visit: Payer: Self-pay

## 2023-02-19 ENCOUNTER — Other Ambulatory Visit: Payer: Self-pay

## 2023-02-19 ENCOUNTER — Ambulatory Visit: Payer: Medicare (Managed Care) | Admitting: Orthopedic Surgery

## 2023-02-19 ENCOUNTER — Encounter: Payer: Self-pay | Admitting: Orthopedic Surgery

## 2023-02-19 VITALS — BP 135/70 | HR 51 | Ht 64.0 in | Wt 175.0 lb

## 2023-02-19 DIAGNOSIS — Z96642 Presence of left artificial hip joint: Secondary | ICD-10-CM

## 2023-02-22 NOTE — Progress Notes (Signed)
This is a confidential patient report written for the express purpose of professional communication.    Subjective:   Lynn Wagner, a patient two years status post left total arthroplasty, has experienced an improvement since the previous encounter. Lynn Wagner reported experiencing intermittent left groin pain, aggravated by squatting, particularly when getting into her car. However, she remains active, participating in the majority of her desired activities.    Objective:   Lynn Wagner presents in no acute distress and is able to ambulate unassisted, exhibiting a non-antalgic gait. Her hip's range of motion is preserved. Office tests today, including axial loading and hip rotation, did not provoke any symptoms.    Imaging:   - Review of two views of the left hip, as of today's date, demonstrated a stable left total arthroplasty, with no evidence of osteolysis or loosening.    Impression:   1. Left total arthroplasty  2. 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 The patient was informed that she is beyond the 18 months during which anterior-based soft tissue complaints are typically considered within a normal timeframe. Future potential evaluations and treatments, such as a CT scan for implant positioning evaluation and targeted SOAS injection, were discussed, but these steps will be considered during her follow-up visit in six months.     Follow-up:  - Further consultation planned in six months where further workup and treatment will be discussed.

## 2023-08-20 ENCOUNTER — Ambulatory Visit
Admission: RE | Admit: 2023-08-20 | Discharge: 2023-08-20 | Disposition: A | Discharge status: Home / Self Care | Payer: Medicare (Managed Care) | Source: Ambulatory Visit

## 2023-08-20 ENCOUNTER — Encounter: Payer: Self-pay | Admitting: Orthopedic Surgery

## 2023-08-20 ENCOUNTER — Ambulatory Visit: Payer: Medicare (Managed Care) | Attending: Orthopedic Surgery | Admitting: Orthopedic Surgery

## 2023-08-20 ENCOUNTER — Other Ambulatory Visit: Payer: Self-pay

## 2023-08-20 VITALS — BP 141/68 | Ht 63.5 in | Wt 169.6 lb

## 2023-08-20 DIAGNOSIS — Z471 Aftercare following joint replacement surgery: Secondary | ICD-10-CM

## 2023-08-20 DIAGNOSIS — Z96642 Presence of left artificial hip joint: Secondary | ICD-10-CM

## 2023-08-20 NOTE — Progress Notes (Signed)
TOTAL HIP ARTHROPLASTY POSTOPERATIVE FOLLOW-UP    Chief Complaint: Status-post left total hip arthroplasty; intermittent left groin pain     HPI: Lynn Wagner presents today for a scheduled 67-month follow-up visit following a left total hip arthroplasty performed in 2022 with subsequent residual left groin pain.  The patient was last seen in our office on 02/19/2023.  Patient states that since her last office visit, her condition has remained relatively unchanged.  She reports intermittent left groin pain that she feels is aggravated by squatting or sitting to standing.  She states that the majority of her desired activities do not elicit this pain or sensation.  She is able to continue to ambulate without the use of an assistive device.  She rates her pain as a 0/10 in severity currently.  Her pain does not require the use of oral analgesics for treatment at this time.  She states her episodes are relatively self resolving with rest and positional change.  She denies interval injury or trauma to the left hip since her last office visit.  She denies presence of fever, chills, shortness of breath.  She denies new onset of lower extremity paresthesias.  She presents to the office today for reexamination and for continued treatment planning regarding ongoing intermittent left groin pain in the setting of a previous left total hip arthroplasty.    ROS:   Constitutional: Negative for chills and fever.   Respiratory: Negative for cough and wheezing.    Cardiovascular: Negative for chest pain and palpitations.   Musculoskeletal: Positive for joint pain.     All other systems are unremarkable.    EXAM: Well-developed, well-nourished, appropriately dressed, healthy appearing 67 y.o.-year-old female in no apparent distress. Mood and affect appropriate. A+O x3. Pt seated comfortably in chair. Able to rise independently from seated position.     Vitals:    08/20/23 1010   BP: 141/68   Weight: 76.9 kg (169 lb 9.6 oz)   Height:  1.613 m (5' 3.5")        Pain    08/20/23 1010   PainSc:   0 - No pain        Constitutional: NAD, well-developed, well-nourished, comfortable   Psychiatric: Mood and affect appropriate  Vascular: Compartments soft and compressible  Musculoskeletal: Ambulating without an antalgic gait.  Left hip:   Joint space is soft and compressible.   Hip range of motion demonstrates:  Flexion to 120 degrees without pain/irritability  IR to 20 degrees without pain/irritability  ER to 10 degrees without pain/irritabilty.   Abduction to 20 degrees without pain/irritabilty.   5/5 abductor/adductor muscle strength   No significant periarticular soft tissue tenderness to palpation  Calf is soft and nontender, distal extremity CMS intact  5/5 DF/PF, ITLT tibial and peroneal nerve, 2+ DP/PT pulse    RADIOGRAPHS: were obtained and personally reviewed. These show a total hip arthroplasty with normal alignment.. There is no acute evidence of interval hardware failure or complication. There is no acute evidence of fracture or dislocation of native bones.     IMPRESSION:   s/p left THA  01/09/2021  Intermittent left groin pain    PLAN:  The patient's x-ray findings and underlying diagnosis reviewed.  Patient's case, clinical presentation and radiographs obtained today were reviewed with Dr. Ardine Eng, who is in agreement with the treatment plan as outlined below.  Patient's repeat left hip radiographs obtained today were reassuring without interval evidence of hardware failure, complication or fracture or dislocation of  the native bones.  Patient was advised that her symptoms are likely attributed to 2 possible etiologies.  The first would be a structural complication with the hip implant itself.  In some cases, the psoas tendon can become adhered or rub against the implanted cup or ball of the total hip implant, resulting in irritation and intermittent symptoms similar to the ones the patient endorses.  The second etiology would be strictly  musculoskeletal in nature with ongoing psoas tendinitis.  To rule out a structural cause of the patient's symptoms, a CT-OMAR scan of the left hip was ordered today for completion at the patient's earliest convenience.  The patient will subsequently have a follow-up appointment arranged after completion of the CT scan to discuss findings.  Should the implants appear normal and in stable position without evidence of soft tissue interference, it is likely that the etiology behind the patient's symptoms are strictly musculoskeletal in nature, and the patient will subsequently be referred for consideration of psoas tendon injection.  The patient and her husband expressed understanding to this treatment plan and are in agreement.  All additional questions were invited and answered to the patient's satisfaction.    FOLLOW-UP: Follow up visit to be arranged upon the completion of the ordered CT study of the patient's left hip.    Hip Precautions: None    Orders Next Visit: None, unless clinically warranted    This document was dictated with Dragon voice recognition software. Please excuse any errors as the document was typeread to the best of my ability.    Jena Gauss, FNP-C  [facility]  Department of Orthopaedic Surgery  Division of Adult Reconstruction    Answers submitted by the patient for this visit:  Left hip (Submitted on 08/20/2023)  Chief Complaint: Left hip pain  Please describe the quality of your pain: : cramping  Is this a work related condition? : No  Questionnaire about: Left hip pain (Submitted on 08/20/2023)  Chief Complaint: Left hip pain

## 2023-08-28 ENCOUNTER — Ambulatory Visit
Admission: RE | Admit: 2023-08-28 | Discharge: 2023-08-28 | Disposition: A | Discharge status: Home / Self Care | Payer: Medicare (Managed Care) | Source: Ambulatory Visit | Attending: Orthopedic Surgery | Admitting: Orthopedic Surgery

## 2023-08-28 ENCOUNTER — Other Ambulatory Visit: Payer: Self-pay

## 2023-08-28 DIAGNOSIS — M25552 Pain in left hip: Secondary | ICD-10-CM | POA: Insufficient documentation

## 2023-08-28 DIAGNOSIS — Z96642 Presence of left artificial hip joint: Secondary | ICD-10-CM | POA: Insufficient documentation

## 2023-09-11 ENCOUNTER — Encounter: Payer: Self-pay | Admitting: Orthopedic Surgery

## 2023-09-11 ENCOUNTER — Other Ambulatory Visit: Payer: Self-pay

## 2023-09-11 ENCOUNTER — Ambulatory Visit: Payer: Medicare (Managed Care) | Attending: Orthopedic Surgery | Admitting: Orthopedic Surgery

## 2023-09-11 ENCOUNTER — Ambulatory Visit: Payer: Medicare (Managed Care) | Admitting: Orthopedic Surgery

## 2023-09-11 VITALS — BP 121/69 | HR 75 | Ht 63.5 in | Wt 169.0 lb

## 2023-09-11 DIAGNOSIS — M7612 Psoas tendinitis, left hip: Secondary | ICD-10-CM

## 2023-09-11 DIAGNOSIS — Z96642 Presence of left artificial hip joint: Secondary | ICD-10-CM

## 2023-09-11 NOTE — Progress Notes (Signed)
This is a confidential patient report written for the express purpose of professional communication.    Subjective:   Lynn Wagner, a patient two years status post left total arthroplasty, has experienced limited improvement since the previous encounter, though she denies limitations of activity, and is eager to ski this season. Here to review recent CT scan.    Objective:   Lynn Wagner presents in no acute distress and is able to ambulate unassisted, exhibiting a non-antalgic gait. Her hip's range of motion is preserved.     Imaging:   - Review of CT left hip, demonstrated a stable left total arthroplasty, with no evidence of osteolysis or loosening. No anterior overhang of cup, no evidence psoas irritation from hardware.    Impression:   1. Left total arthroplasty  2. 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

## 2023-10-08 ENCOUNTER — Other Ambulatory Visit
Admission: RE | Admit: 2023-10-08 | Discharge: 2023-10-08 | Disposition: A | Discharge status: Home / Self Care | Payer: Medicare (Managed Care) | Source: Ambulatory Visit | Attending: Internal Medicine | Admitting: Internal Medicine

## 2023-10-08 DIAGNOSIS — R7303 Prediabetes: Secondary | ICD-10-CM | POA: Insufficient documentation

## 2023-10-08 DIAGNOSIS — E669 Obesity, unspecified: Secondary | ICD-10-CM | POA: Insufficient documentation

## 2023-10-08 DIAGNOSIS — I10 Essential (primary) hypertension: Secondary | ICD-10-CM | POA: Insufficient documentation

## 2023-10-08 DIAGNOSIS — Z0000 Encounter for general adult medical examination without abnormal findings: Secondary | ICD-10-CM | POA: Insufficient documentation

## 2023-10-08 DIAGNOSIS — E781 Pure hyperglyceridemia: Secondary | ICD-10-CM | POA: Insufficient documentation

## 2023-10-08 DIAGNOSIS — E7800 Pure hypercholesterolemia, unspecified: Secondary | ICD-10-CM | POA: Insufficient documentation

## 2023-10-08 LAB — LIPID PANEL
Chol/HDL Ratio: 3
Cholesterol: 192 mg/dL
HDL: 64 mg/dL — ABNORMAL HIGH (ref 40–60)
LDL Calculated: 111 mg/dL
Non HDL Cholesterol: 128 mg/dL
Triglycerides: 86 mg/dL

## 2023-10-08 LAB — TSH: TSH: 1.23 u[IU]/mL (ref 0.27–4.20)

## 2023-10-08 LAB — CBC AND DIFFERENTIAL
Baso # K/uL: 0 10*3/uL (ref 0.0–0.2)
Eos # K/uL: 0.2 10*3/uL (ref 0.0–0.5)
Hematocrit: 42 % (ref 34–49)
Hemoglobin: 13.3 g/dL (ref 11.2–16.0)
IMM Granulocytes #: 0 10*3/uL (ref 0.0–0.0)
IMM Granulocytes: 0.7 %
Lymph # K/uL: 2.3 10*3/uL (ref 1.0–5.0)
MCV: 96 fL (ref 75–100)
Mono # K/uL: 0.6 10*3/uL (ref 0.1–1.0)
Neut # K/uL: 2.9 10*3/uL (ref 1.5–6.5)
Nucl RBC # K/uL: 0 10*3/uL (ref 0.0–0.0)
Nucl RBC %: 0 /100{WBCs} (ref 0.0–0.2)
Platelets: 288 10*3/uL (ref 150–450)
RBC: 4.3 MIL/uL (ref 4.0–5.5)
RDW: 13.6 % (ref 0.0–15.0)
Seg Neut %: 48.2 %
WBC: 6 10*3/uL (ref 3.5–11.0)

## 2023-10-08 LAB — COMPREHENSIVE METABOLIC PANEL
ALT: 22 U/L (ref 0–35)
AST: 24 U/L (ref 0–35)
Albumin: 4 g/dL (ref 3.5–5.2)
Alk Phos: 86 U/L (ref 35–105)
Anion Gap: 10 (ref 7–16)
Bilirubin,Total: 0.3 mg/dL (ref 0.0–1.2)
CO2: 25 mmol/L (ref 20–28)
Calcium: 9.4 mg/dL (ref 8.6–10.2)
Chloride: 109 mmol/L — ABNORMAL HIGH (ref 96–108)
Creatinine: 0.81 mg/dL (ref 0.51–0.95)
Glucose: 98 mg/dL (ref 60–99)
Lab: 18 mg/dL (ref 6–20)
Potassium: 4.6 mmol/L (ref 3.3–5.1)
Sodium: 144 mmol/L (ref 133–145)
Total Protein: 5.9 g/dL — ABNORMAL LOW (ref 6.3–7.7)
eGFR BY CREAT: 79 *

## 2023-10-08 LAB — MICROALBUMIN, URINE, RANDOM
Creatinine,UR: 74 mg/dL (ref 20–300)
Microalbumin,UR: <1.2 mg/dL

## 2023-10-08 LAB — HIGH SENSITIVITY CRP: CRP,High Sensitivity: 4 mg/L — AB

## 2023-10-09 LAB — HEMOGLOBIN A1C: Hemoglobin A1C: 5.5 %

## 2024-02-28 ENCOUNTER — Other Ambulatory Visit: Payer: Self-pay | Admitting: Internal Medicine

## 2024-02-28 DIAGNOSIS — Z1231 Encounter for screening mammogram for malignant neoplasm of breast: Secondary | ICD-10-CM

## 2024-03-25 ENCOUNTER — Other Ambulatory Visit: Payer: Self-pay

## 2024-03-25 ENCOUNTER — Ambulatory Visit
Admission: RE | Admit: 2024-03-25 | Discharge: 2024-03-25 | Disposition: A | Discharge status: Home / Self Care | Payer: Medicare (Managed Care) | Source: Ambulatory Visit | Attending: Internal Medicine | Admitting: Internal Medicine

## 2024-03-25 DIAGNOSIS — Z1231 Encounter for screening mammogram for malignant neoplasm of breast: Secondary | ICD-10-CM | POA: Insufficient documentation

## 2024-03-25 DIAGNOSIS — Z803 Family history of malignant neoplasm of breast: Secondary | ICD-10-CM

## 2024-08-03 ENCOUNTER — Other Ambulatory Visit: Payer: Self-pay | Admitting: Internal Medicine

## 2024-08-03 DIAGNOSIS — M810 Age-related osteoporosis without current pathological fracture: Secondary | ICD-10-CM

## 2024-08-10 ENCOUNTER — Other Ambulatory Visit: Payer: Self-pay

## 2024-08-11 ENCOUNTER — Ambulatory Visit
Admission: RE | Admit: 2024-08-11 | Discharge: 2024-08-11 | Disposition: A | Discharge status: Home / Self Care | Payer: Medicare (Managed Care) | Source: Ambulatory Visit | Attending: Internal Medicine | Admitting: Internal Medicine

## 2024-08-11 DIAGNOSIS — M810 Age-related osteoporosis without current pathological fracture: Secondary | ICD-10-CM | POA: Insufficient documentation

## 2024-08-11 DIAGNOSIS — M8588 Other specified disorders of bone density and structure, other site: Secondary | ICD-10-CM | POA: Insufficient documentation

## 2024-08-20 ENCOUNTER — Other Ambulatory Visit
Admission: RE | Admit: 2024-08-20 | Discharge: 2024-08-20 | Disposition: A | Discharge status: Home / Self Care | Payer: Medicare (Managed Care) | Source: Ambulatory Visit | Attending: Internal Medicine | Admitting: Internal Medicine

## 2024-08-20 DIAGNOSIS — B999 Unspecified infectious disease: Secondary | ICD-10-CM | POA: Insufficient documentation

## 2024-08-27 LAB — ARTHROPOD IDENTIFICATION

## 2024-09-21 ENCOUNTER — Other Ambulatory Visit
Admission: RE | Admit: 2024-09-21 | Discharge: 2024-09-21 | Disposition: A | Discharge status: Home / Self Care | Payer: Medicare (Managed Care) | Source: Ambulatory Visit | Attending: Internal Medicine | Admitting: Internal Medicine

## 2024-09-21 DIAGNOSIS — Z0000 Encounter for general adult medical examination without abnormal findings: Secondary | ICD-10-CM | POA: Insufficient documentation

## 2024-09-21 DIAGNOSIS — W57XXXA Bitten or stung by nonvenomous insect and other nonvenomous arthropods, initial encounter: Secondary | ICD-10-CM | POA: Insufficient documentation

## 2024-09-21 DIAGNOSIS — F1010 Alcohol abuse, uncomplicated: Secondary | ICD-10-CM | POA: Insufficient documentation

## 2024-09-21 DIAGNOSIS — E559 Vitamin D deficiency, unspecified: Secondary | ICD-10-CM | POA: Insufficient documentation

## 2024-09-21 DIAGNOSIS — E663 Overweight: Secondary | ICD-10-CM | POA: Insufficient documentation

## 2024-09-21 DIAGNOSIS — B999 Unspecified infectious disease: Secondary | ICD-10-CM | POA: Insufficient documentation

## 2024-09-21 DIAGNOSIS — D513 Other dietary vitamin B12 deficiency anemia: Secondary | ICD-10-CM | POA: Insufficient documentation

## 2024-09-21 DIAGNOSIS — Y9289 Other specified places as the place of occurrence of the external cause: Secondary | ICD-10-CM | POA: Insufficient documentation

## 2024-09-21 LAB — CBC AND DIFFERENTIAL
Baso # K/uL: 0 THOU/uL (ref 0.0–0.2)
Eos # K/uL: 0.1 THOU/uL (ref 0.0–0.5)
Hematocrit: 43 % (ref 34–49)
Hemoglobin: 14.1 g/dL (ref 11.2–16.0)
IMM Granulocytes #: 0 THOU/uL (ref 0–0)
IMM Granulocytes: 0.2 %
Lymph # K/uL: 1.6 THOU/uL (ref 1.0–5.0)
MCV: 95 fL (ref 75–100)
Mono # K/uL: 0.4 THOU/uL (ref 0.1–1.0)
Neut # K/uL: 2.2 THOU/uL (ref 1.5–6.5)
Nucl RBC # K/uL: 0 THOU/uL
Nucl RBC %: 0 /100{WBCs} (ref 0.0–0.2)
Platelets: 247 THOU/uL (ref 150–450)
RBC: 4.5 MIL/uL (ref 4.0–5.5)
RDW: 13.2 % (ref 0.0–15.0)
Seg Neut %: 50.8 %
WBC: 4.3 THOU/uL (ref 3.5–11.0)

## 2024-09-21 LAB — COMPREHENSIVE METABOLIC PANEL
ALT: 19 U/L (ref 0–35)
AST: 19 U/L (ref 0–35)
Albumin: 4.3 g/dL (ref 3.5–5.2)
Alk Phos: 89 U/L (ref 35–105)
Anion Gap: 13 (ref 7–16)
Bilirubin,Total: 0.4 mg/dL (ref 0.0–1.2)
CO2: 24 mmol/L (ref 20–28)
Calcium: 9.2 mg/dL (ref 8.6–10.2)
Chloride: 105 mmol/L (ref 96–108)
Creatinine: 0.87 mg/dL (ref 0.51–0.95)
Glucose: 91 mg/dL (ref 60–99)
Lab: 14 mg/dL (ref 6–20)
Potassium: 4.6 mmol/L (ref 3.3–5.1)
Sodium: 142 mmol/L (ref 133–145)
Total Protein: 6.4 g/dL (ref 6.3–7.7)
eGFR BY CREAT: 72

## 2024-09-21 LAB — MICROALBUMIN, URINE, RANDOM
Creatinine,UR: 72 mg/dL (ref 20–300)
Microalbumin,UR: <1.2 mg/dL

## 2024-09-21 LAB — FERRITIN: Ferritin: 122 ng/mL — ABNORMAL HIGH (ref 10–120)

## 2024-09-21 LAB — LIPID PANEL
Chol/HDL Ratio: 3.5
Cholesterol: 245 mg/dL — AB
HDL: 70 mg/dL — ABNORMAL HIGH (ref 40–60)
LDL Calculated: 157 mg/dL — AB
Non HDL Cholesterol: 175 mg/dL
Triglycerides: 103 mg/dL

## 2024-09-21 LAB — HIGH SENSITIVITY CRP: CRP,High Sensitivity: 2.2 mg/L

## 2024-09-21 LAB — TSH: TSH: 1.44 u[IU]/mL (ref 0.27–4.20)

## 2024-09-21 LAB — GGT: GGT: 14 U/L (ref 5–36)

## 2024-09-21 LAB — VITAMIN D: 25-OH Vit Total: 31 ng/mL (ref 30–60)

## 2024-09-21 LAB — VITAMIN B12: Vitamin B12: 575 pg/mL (ref 232–1245)

## 2024-09-21 LAB — FOLATE: Folate: >14 ng/mL (ref >=4.6)

## 2024-09-22 LAB — LYME AB SCREEN: Lyme AB Screen: NEGATIVE

## 2024-09-22 LAB — HEMOGLOBIN A1C: Hemoglobin A1C: 5.5 % (ref <=5.6)

## 2024-10-02 ENCOUNTER — Other Ambulatory Visit
Admission: RE | Admit: 2024-10-02 | Discharge: 2024-10-02 | Disposition: A | Payer: Medicare (Managed Care) | Source: Ambulatory Visit | Attending: Internal Medicine | Admitting: Internal Medicine

## 2024-10-02 DIAGNOSIS — Z0189 Encounter for other specified special examinations: Secondary | ICD-10-CM | POA: Insufficient documentation

## 2024-10-12 LAB — OCCULT BLOOD X 1, STOOL: Occult Blood 1: NEGATIVE

## 2026-10-10 ENCOUNTER — Encounter: Payer: Self-pay | Admitting: Gastroenterology
